# Patient Record
Sex: Female | Born: 1935 | Race: White | Hispanic: No | Smoking: Former smoker
Health system: Southern US, Community
[De-identification: ages and names within clinical notes are randomized; demographics above are authoritative.]

## PROBLEM LIST (undated history)

## (undated) DIAGNOSIS — Z681 Body mass index (BMI) 19 or less, adult: Secondary | ICD-10-CM

## (undated) DIAGNOSIS — D62 Acute posthemorrhagic anemia: Secondary | ICD-10-CM

## (undated) DIAGNOSIS — F419 Anxiety disorder, unspecified: Secondary | ICD-10-CM

## (undated) DIAGNOSIS — R011 Cardiac murmur, unspecified: Secondary | ICD-10-CM

## (undated) DIAGNOSIS — R636 Underweight: Secondary | ICD-10-CM

## (undated) DIAGNOSIS — I10 Essential (primary) hypertension: Secondary | ICD-10-CM

## (undated) DIAGNOSIS — W19XXXA Unspecified fall, initial encounter: Secondary | ICD-10-CM

## (undated) DIAGNOSIS — Z87310 Personal history of (healed) osteoporosis fracture: Secondary | ICD-10-CM

## (undated) DIAGNOSIS — N182 Chronic kidney disease, stage 2 (mild): Secondary | ICD-10-CM

## (undated) HISTORY — DX: Body mass index (BMI) 19.9 or less, adult: Z68.1

## (undated) HISTORY — DX: Acute posthemorrhagic anemia: D62

## (undated) HISTORY — DX: Unspecified fall, initial encounter: W19.XXXA

## (undated) HISTORY — DX: Personal history of (healed) osteoporosis fracture: Z87.310

## (undated) HISTORY — DX: Cardiac murmur, unspecified: R01.1

## (undated) HISTORY — DX: Chronic kidney disease, stage 2 (mild): N18.2

## (undated) HISTORY — PX: ABDOMINAL HYSTERECTOMY: SHX81

## (undated) HISTORY — DX: Underweight: R63.6

---

## 1998-03-14 ENCOUNTER — Ambulatory Visit (HOSPITAL_COMMUNITY): Admission: RE | Admit: 1998-03-14 | Discharge: 1998-03-14 | Payer: Self-pay

## 2005-07-24 ENCOUNTER — Other Ambulatory Visit: Admission: RE | Admit: 2005-07-24 | Discharge: 2005-07-24 | Payer: Self-pay | Admitting: Obstetrics & Gynecology

## 2018-05-26 ENCOUNTER — Emergency Department (HOSPITAL_COMMUNITY): Payer: Medicare Other

## 2018-05-26 ENCOUNTER — Other Ambulatory Visit: Payer: Self-pay

## 2018-05-26 ENCOUNTER — Inpatient Hospital Stay (HOSPITAL_COMMUNITY)
Admission: EM | Admit: 2018-05-26 | Discharge: 2018-05-31 | DRG: 470 | Disposition: A | Discharge status: Skilled Nursing Facility | Payer: Medicare Other | Attending: Nephrology | Admitting: Nephrology

## 2018-05-26 ENCOUNTER — Encounter (HOSPITAL_COMMUNITY): Payer: Self-pay | Admitting: Emergency Medicine

## 2018-05-26 DIAGNOSIS — S72011A Unspecified intracapsular fracture of right femur, initial encounter for closed fracture: Principal | ICD-10-CM | POA: Diagnosis present

## 2018-05-26 DIAGNOSIS — F419 Anxiety disorder, unspecified: Secondary | ICD-10-CM | POA: Diagnosis present

## 2018-05-26 DIAGNOSIS — Z87891 Personal history of nicotine dependence: Secondary | ICD-10-CM

## 2018-05-26 DIAGNOSIS — F0390 Unspecified dementia without behavioral disturbance: Secondary | ICD-10-CM | POA: Diagnosis present

## 2018-05-26 DIAGNOSIS — E871 Hypo-osmolality and hyponatremia: Secondary | ICD-10-CM | POA: Diagnosis present

## 2018-05-26 DIAGNOSIS — Z886 Allergy status to analgesic agent status: Secondary | ICD-10-CM

## 2018-05-26 DIAGNOSIS — S72001A Fracture of unspecified part of neck of right femur, initial encounter for closed fracture: Secondary | ICD-10-CM | POA: Diagnosis not present

## 2018-05-26 DIAGNOSIS — D649 Anemia, unspecified: Secondary | ICD-10-CM | POA: Diagnosis not present

## 2018-05-26 DIAGNOSIS — M25551 Pain in right hip: Secondary | ICD-10-CM | POA: Diagnosis present

## 2018-05-26 DIAGNOSIS — T148XXA Other injury of unspecified body region, initial encounter: Secondary | ICD-10-CM

## 2018-05-26 DIAGNOSIS — Z885 Allergy status to narcotic agent status: Secondary | ICD-10-CM

## 2018-05-26 DIAGNOSIS — K56609 Unspecified intestinal obstruction, unspecified as to partial versus complete obstruction: Secondary | ICD-10-CM | POA: Diagnosis present

## 2018-05-26 DIAGNOSIS — W19XXXA Unspecified fall, initial encounter: Secondary | ICD-10-CM

## 2018-05-26 DIAGNOSIS — Y92019 Unspecified place in single-family (private) house as the place of occurrence of the external cause: Secondary | ICD-10-CM

## 2018-05-26 DIAGNOSIS — W1830XA Fall on same level, unspecified, initial encounter: Secondary | ICD-10-CM | POA: Diagnosis present

## 2018-05-26 DIAGNOSIS — I10 Essential (primary) hypertension: Secondary | ICD-10-CM | POA: Diagnosis present

## 2018-05-26 DIAGNOSIS — R42 Dizziness and giddiness: Secondary | ICD-10-CM | POA: Diagnosis not present

## 2018-05-26 DIAGNOSIS — D62 Acute posthemorrhagic anemia: Secondary | ICD-10-CM

## 2018-05-26 HISTORY — DX: Essential (primary) hypertension: I10

## 2018-05-26 HISTORY — DX: Anemia, unspecified: D64.9

## 2018-05-26 HISTORY — DX: Anxiety disorder, unspecified: F41.9

## 2018-05-26 HISTORY — DX: Hypo-osmolality and hyponatremia: E87.1

## 2018-05-26 HISTORY — DX: Fracture of unspecified part of neck of right femur, initial encounter for closed fracture: S72.001A

## 2018-05-26 LAB — CBC WITH DIFFERENTIAL/PLATELET
Abs Immature Granulocytes: 0.04 10*3/uL (ref 0.00–0.07)
BASOS PCT: 0 %
Basophils Absolute: 0 10*3/uL (ref 0.0–0.1)
Eosinophils Absolute: 0 10*3/uL (ref 0.0–0.5)
Eosinophils Relative: 1 %
HCT: 31.4 % — ABNORMAL LOW (ref 36.0–46.0)
Hemoglobin: 9.7 g/dL — ABNORMAL LOW (ref 12.0–15.0)
Immature Granulocytes: 1 %
Lymphocytes Relative: 6 %
Lymphs Abs: 0.5 10*3/uL — ABNORMAL LOW (ref 0.7–4.0)
MCH: 29.8 pg (ref 26.0–34.0)
MCHC: 30.9 g/dL (ref 30.0–36.0)
MCV: 96.3 fL (ref 80.0–100.0)
MONO ABS: 0.5 10*3/uL (ref 0.1–1.0)
Monocytes Relative: 6 %
Neutro Abs: 7.3 10*3/uL (ref 1.7–7.7)
Neutrophils Relative %: 86 %
Platelets: 147 10*3/uL — ABNORMAL LOW (ref 150–400)
RBC: 3.26 MIL/uL — ABNORMAL LOW (ref 3.87–5.11)
RDW: 12.8 % (ref 11.5–15.5)
WBC: 8.4 10*3/uL (ref 4.0–10.5)
nRBC: 0 % (ref 0.0–0.2)

## 2018-05-26 LAB — PROTIME-INR
INR: 1 (ref 0.8–1.2)
Prothrombin Time: 13.4 s (ref 11.4–15.2)

## 2018-05-26 LAB — COMPREHENSIVE METABOLIC PANEL WITH GFR
ALT: 17 U/L (ref 0–44)
AST: 29 U/L (ref 15–41)
Albumin: 4.2 g/dL (ref 3.5–5.0)
Alkaline Phosphatase: 74 U/L (ref 38–126)
Anion gap: 6 (ref 5–15)
BUN: 18 mg/dL (ref 8–23)
CO2: 25 mmol/L (ref 22–32)
Calcium: 9.3 mg/dL (ref 8.9–10.3)
Chloride: 103 mmol/L (ref 98–111)
Creatinine, Ser: 1.07 mg/dL — ABNORMAL HIGH (ref 0.44–1.00)
GFR calc Af Amer: 56 mL/min — ABNORMAL LOW
GFR calc non Af Amer: 48 mL/min — ABNORMAL LOW
Glucose, Bld: 135 mg/dL — ABNORMAL HIGH (ref 70–99)
Potassium: 4.5 mmol/L (ref 3.5–5.1)
Sodium: 134 mmol/L — ABNORMAL LOW (ref 135–145)
Total Bilirubin: 0.4 mg/dL (ref 0.3–1.2)
Total Protein: 6.8 g/dL (ref 6.5–8.1)

## 2018-05-26 MED ORDER — HEPARIN SODIUM (PORCINE) 5000 UNIT/ML IJ SOLN
5000.0000 [IU] | Freq: Three times a day (TID) | INTRAMUSCULAR | Status: DC
  Administered 2018-05-26: 5000 [IU] via SUBCUTANEOUS

## 2018-05-26 MED ORDER — ONDANSETRON HCL 4 MG PO TABS: 4.0000 mg | ORAL_TABLET | Freq: Four times a day (QID) | ORAL | Status: DC | PRN

## 2018-05-26 MED ORDER — HEPARIN SODIUM (PORCINE) 5000 UNIT/ML IJ SOLN: 5000.0000 [IU] | Freq: Three times a day (TID) | INTRAMUSCULAR | Status: DC

## 2018-05-26 MED ORDER — ACETAMINOPHEN 650 MG RE SUPP: 650.0000 mg | Freq: Four times a day (QID) | RECTAL | Status: DC | PRN

## 2018-05-26 MED ORDER — SODIUM CHLORIDE 0.9 % IV SOLN
INTRAVENOUS | Status: DC
  Administered 2018-05-27 (×3): via INTRAVENOUS

## 2018-05-26 MED ORDER — FENTANYL CITRATE (PF) 100 MCG/2ML IJ SOLN
50.0000 ug | INTRAMUSCULAR | Status: DC | PRN
  Administered 2018-05-26 – 2018-05-27 (×2): 50 ug via INTRAVENOUS
  Filled 2018-05-26 (×2): qty 2

## 2018-05-26 MED ORDER — FENTANYL CITRATE (PF) 100 MCG/2ML IJ SOLN
50.0000 ug | Freq: Once | INTRAMUSCULAR | Status: AC
  Administered 2018-05-26: 50 ug via INTRAVENOUS
  Filled 2018-05-26: qty 2

## 2018-05-26 MED ORDER — ACETAMINOPHEN 325 MG PO TABS
650.0000 mg | ORAL_TABLET | Freq: Four times a day (QID) | ORAL | Status: DC | PRN
  Administered 2018-05-27: 650 mg via ORAL
  Filled 2018-05-26: qty 2

## 2018-05-26 MED ORDER — ONDANSETRON HCL 4 MG/2ML IJ SOLN
4.0000 mg | Freq: Four times a day (QID) | INTRAMUSCULAR | Status: DC | PRN
  Filled 2018-05-26: qty 2

## 2018-05-26 NOTE — ED Triage Notes (Signed)
Pt BIB [employer], mechanical fall at home. C/o right hip pain. EMS noted rotation and shortening of the right leg. EMS VSS. Denies dizziness, LOC or hitting her head at time of fall.

## 2018-05-26 NOTE — H&P (Signed)
History and Physical    Lydia Klein FMB:[id number] DOB: [birth year] DOA: 05/26/2018  PCP: Elisabeth Most, FNP  Patient coming from: Home.  I have personally briefly reviewed patient's old medical records in [facility]  Chief Complaint: Fall.  HPI: Lydia Klein is a 85 y.o. female with medical history significant of hypertension, anxiety who had an accidental fall at home landing on her right side, developing pain immediately and inability to get up.  She had her son called EMS and she was subsequently transported to the emergency department.  She denies fever, chills, sore throat, dyspnea, wheezing, hemoptysis, chest pain, palpitations, diaphoresis, dizziness, PND, orthopnea or pitting edema of the lower extremities.  No abdominal pain, nausea or emesis, constipation or diarrhea, hematochezia or melena.  No dysuria, frequency hematuria.  Denies polyuria, polydipsia, polyphagia or blurred vision.  ED Course: Initial vital signs temperature 98.7 F, pulse 97, respirations 16, 103/74 mmHg and O2 sat 98% on room air.  Patient received fentanyl 50 mcg IVP in the emergency department.  White count is 8.4, hemoglobin 9.7 g/dL and platelets 701.  PT was 13.4 seconds and INR 1.0.  CMP shows a sodium 134 mmol/L.  Glucose 135 and creatinine 1.07 mg/dL.  All other values are within normal limits.  Imaging: Pelvic and femur x-ray showed displaced fracture within the subcapital portion of the right femoral neck.  Chest radiograph no active disease.  CT head without contrast no acute intracranial pathology.  Review of Systems: As per HPI otherwise 10 point review of systems negative.   Past Medical History:  Diagnosis Date  . Hypertension     History reviewed. No pertinent surgical history.   reports that she has quit smoking. She has never used smokeless tobacco. She reports previous alcohol use. She reports that she does not use drugs.  Allergies  Allergen Reactions  . Codeine     . Ibuprofen Swelling   Family history Per patient, both parents died of old age. She does not know of any other medical history in her family  Prior to Admission medications   Not on File    Physical Exam: Vitals:   05/26/18 2115 05/26/18 2145 05/26/18 2200 05/26/18 2244  BP: (!) 131/56  (!) 136/55 (!) 129/58  Pulse: 96  94 94  Resp: 14 17  18   Temp:    99 F (37.2 C)  TempSrc:    Oral  SpO2: 99%  99% 98%    Constitutional: NAD, calm, comfortable Eyes: PERRL, lids and conjunctivae normal ENMT: Mucous membranes are moist. Posterior pharynx clear of any exudate or lesions. Neck: normal, supple, no masses, no thyromegaly Respiratory: clear to auscultation bilaterally, no wheezing, no crackles. Normal respiratory effort. No accessory muscle use.  Cardiovascular: Regular rate and rhythm, no murmurs / rubs / gallops. No extremity edema. 2+ pedal pulses. No carotid bruits.  Abdomen: Soft, no tenderness, no masses palpated. No hepatosplenomegaly. Bowel sounds positive.  Musculoskeletal: no clubbing / cyanosis.  RLE is shortened and externally rotated, severely decreased ROM of RLE, Positive right hip tenderness on palpation. no contractures. Normal muscle tone.  Skin: no rashes, lesions, ulcers. No induration Neurologic: CN 2-12 grossly intact. Sensation intact, DTR normal. Strength 5/5 in all 4.  Psychiatric: Normal judgment and insight. Alert and oriented x 3. Normal mood.   Labs on Admission: I have personally reviewed following labs and imaging studies  CBC: Recent Labs  Lab 05/26/18 1945  WBC 8.4  NEUTROABS 7.3  HGB 9.7*  HCT 31.4*  MCV 96.3  PLT 147*   Basic Metabolic Panel: Recent Labs  Lab 05/26/18 1908  NA 134*  K 4.5  CL 103  CO2 25  GLUCOSE 135*  BUN 18  CREATININE 1.07*  CALCIUM 9.3   GFR: CrCl cannot be calculated (Unknown ideal weight.). Liver Function Tests: Recent Labs  Lab 05/26/18 1908  AST 29  ALT 17  ALKPHOS 74  BILITOT 0.4  PROT 6.8   ALBUMIN 4.2   No results for input(s): LIPASE, AMYLASE in the last 168 hours. No results for input(s): AMMONIA in the last 168 hours. Coagulation Profile: Recent Labs  Lab 05/26/18 1908  INR 1.0   Cardiac Enzymes: No results for input(s): CKTOTAL, CKMB, CKMBINDEX, TROPONINI in the last 168 hours. BNP (last 3 results) No results for input(s): PROBNP in the last 8760 hours. HbA1C: No results for input(s): HGBA1C in the last 72 hours. CBG: No results for input(s): GLUCAP in the last 168 hours. Lipid Profile: No results for input(s): CHOL, HDL, LDLCALC, TRIG, CHOLHDL, LDLDIRECT in the last 72 hours. Thyroid Function Tests: No results for input(s): TSH, T4TOTAL, FREET4, T3FREE, THYROIDAB in the last 72 hours. Anemia Panel: No results for input(s): VITAMINB12, FOLATE, FERRITIN, TIBC, IRON, RETICCTPCT in the last 72 hours. Urine analysis: No results found for: COLORURINE, APPEARANCEUR, LABSPEC, PHURINE, GLUCOSEU, HGBUR, BILIRUBINUR, KETONESUR, PROTEINUR, UROBILINOGEN, NITRITE, LEUKOCYTESUR  Radiological Exams on Admission: Dg Chest 1 View  Result Date: 05/26/2018 CLINICAL DATA:  Fall, pain. EXAM: CHEST  1 VIEW COMPARISON:  None. FINDINGS: Heart size and mediastinal contours are within normal limits. Lungs are clear. No pleural effusion or pneumothorax seen. Osseous structures about the chest are unremarkable. IMPRESSION: No active disease. Electronically Signed   By: Bary Richard M.D.   On: 05/26/2018 20:35   Dg Pelvis 1-2 Views  Result Date: 05/26/2018 CLINICAL DATA:  Fall, pain. EXAM: PELVIS - 1-2 VIEW COMPARISON:  None. FINDINGS: Significantly displaced fracture within the subcapital region of the RIGHT femoral neck. No fractures seen within the adjacent osseous pelvis or about the LEFT hip. IMPRESSION: Significantly displaced fracture within the subcapital region of the RIGHT femoral neck. Electronically Signed   By: Bary Richard M.D.   On: 05/26/2018 20:34   Ct Head Wo  Contrast  Result Date: 05/26/2018 CLINICAL DATA:  85 y/o  F; mechanical fall to the right-sided body. EXAM: CT HEAD WITHOUT CONTRAST TECHNIQUE: Contiguous axial images were obtained from the base of the skull through the vertex without intravenous contrast. COMPARISON:  None. FINDINGS: Brain: No evidence of acute infarction, hemorrhage, hydrocephalus, extra-axial collection or mass lesion/mass effect. Nonspecific white matter hypodensities are compatible with mild chronic microvascular ischemic changes and there is mild volume loss of the brain for age. Vascular: Calcific atherosclerosis of the carotid siphons. No hyperdense vessel identified. Skull: Normal. Negative for fracture or focal lesion. Sinuses/Orbits: Small left maxillary and left posterior ethmoid air cell mucous retention cyst. Additional visible paranasal sinuses and the mastoid air cells are normally aerated. Other: None. IMPRESSION: 1. No acute intracranial abnormality identified. 2. Mild chronic microvascular ischemic changes and mild volume loss of the brain for age. Electronically Signed   By: Mitzi Hansen M.D.   On: 05/26/2018 21:41   Dg Femur, Min 2 Views Right  Result Date: 05/26/2018 CLINICAL DATA:  Fall, pain. EXAM: RIGHT FEMUR 2 VIEWS COMPARISON:  None. FINDINGS: Displaced fracture within the subcapital portion of the RIGHT femoral neck. Femoral head remains normally positioned relative to the acetabulum. Nearly  90 degree angulation deformity at the fracture site. Remainder of the RIGHT femur appears intact and normally aligned. IMPRESSION: Displaced fracture within the subcapital portion of the RIGHT femoral neck. Electronically Signed   By: Bary Richard M.D.   On: 05/26/2018 20:33    EKG: Independently reviewed. Ordered. Still pending.  Assessment/Plan Principal Problem:   Closed right hip fracture, initial encounter (HCC) Admit to telemetry/inpatient. Keep n.p.o. Continue IV fluids. Analgesics as  needed. Antiemetics as needed. Will need hip surgical repair or replacement. Orthopedic surgery will evaluate in a.m.  Active Problems:   Hypertension On unknown antihypertensive at home. Will use low-dose PRN IV metoprolol. Monitor blood pressure.    Hyponatremia Minimal. Continue normal saline infusion. Follow-up sodium level.    Normocytic anemia Check anemia panel. Follow-up H&H.   DVT prophylaxis:  Heparin SQ. Code Status: Full code. Family Communication: Disposition Plan: Admit for pain control, orthopedic surgery evaluation. Consults called: Orthopedic surgery will evaluate the patient in the morning. Admission status: Inpatient/telemetry.   Bobette Mo MD [facility]  05/26/2018, 11:00 PM   This document was prepared using Dragon voice recognition software and may contain some unintended transcription errors.

## 2018-05-26 NOTE — ED Provider Notes (Signed)
[facility] EMERGENCY DEPARTMENT Provider Note   CSN: [id number] Arrival date & time: 05/26/18  1856    History   Chief Complaint Chief Complaint  Patient presents with  . Fall  . Hip Pain    HPI Lydia Klein is a 85 y.o. female.      Fall  This is a new problem. The current episode started 1 to 2 hours ago. The problem occurs rarely. The problem has not changed since onset.Pertinent negatives include no chest pain, no abdominal pain, no headaches and no shortness of breath. Nothing aggravates the symptoms. Nothing relieves the symptoms. She has tried nothing for the symptoms. The treatment provided no relief.  Hip Pain  This is a new problem. The current episode started 1 to 2 hours ago. The problem occurs constantly. The problem has not changed since onset.Pertinent negatives include no chest pain, no abdominal pain, no headaches and no shortness of breath. The symptoms are aggravated by walking. The symptoms are relieved by lying down, rest, position and relaxation. She has tried nothing for the symptoms. The treatment provided no relief.    Past Medical History:  Diagnosis Date  . Hypertension     Patient Active Problem List   Diagnosis Date Noted  . Closed right hip fracture, initial encounter (HCC) 05/26/2018  . Hypertension 05/26/2018  . Hyponatremia 05/26/2018  . Normocytic anemia 05/26/2018    History reviewed. No pertinent surgical history.   OB History   No obstetric history on file.      Home Medications    Prior to Admission medications   Not on File    Family History History reviewed. No pertinent family history.  Social History Social History   Tobacco Use  . Smoking status: Former Games developer  . Smokeless tobacco: Never Used  Substance Use Topics  . Alcohol use: Not Currently  . Drug use: Never     Allergies   Codeine and Ibuprofen   Review of Systems Review of Systems  Constitutional: Negative for chills and  fever.  HENT: Negative for ear pain and sore throat.   Eyes: Negative for pain and visual disturbance.  Respiratory: Negative for cough and shortness of breath.   Cardiovascular: Negative for chest pain and palpitations.  Gastrointestinal: Negative for abdominal pain and vomiting.  Genitourinary: Negative for dysuria and hematuria.  Musculoskeletal: Negative for arthralgias and back pain.  Skin: Negative for color change and rash.  Neurological: Negative for seizures, syncope and headaches.  All other systems reviewed and are negative.    Physical Exam Updated Vital Signs BP (!) 129/58 (BP Location: Left Arm)   Pulse 94   Temp 99 F (37.2 C) (Oral)   Resp 18   SpO2 98%   Physical Exam Vitals signs and nursing note reviewed.  Constitutional:      General: She is not in acute distress.    Appearance: She is well-developed.     Comments: Patient resting comfortably, no acute distress.  HENT:     Head: Normocephalic and atraumatic.  Eyes:     Conjunctiva/sclera: Conjunctivae normal.  Neck:     Musculoskeletal: Neck supple.     Comments: No C-spine tenderness Cardiovascular:     Rate and Rhythm: Normal rate and regular rhythm.     Heart sounds: No murmur.  Pulmonary:     Effort: Pulmonary effort is normal. No respiratory distress.     Breath sounds: Normal breath sounds.  Abdominal:     Palpations: Abdomen  is soft.     Tenderness: There is no abdominal tenderness.  Musculoskeletal:        General: Tenderness and signs of injury present.     Comments: Decreased range of motion of the right hip due to pain, right extremity is externally rotated, minimally shortened.  Intact pulse, motor, sensory distal to the injury.  No overlying skin changes, abrasions.  No tenderness to the T, L-spine.  Skin:    General: Skin is warm and dry.     Capillary Refill: Capillary refill takes less than 2 seconds.  Neurological:     General: No focal deficit present.     Mental Status: She  is alert.  Psychiatric:        Mood and Affect: Mood normal.      ED Treatments / Results  Labs (all labs ordered are listed, but only abnormal results are displayed) Labs Reviewed  COMPREHENSIVE METABOLIC PANEL - Abnormal; Notable for the following components:      Result Value   Sodium 134 (*)    Glucose, Bld 135 (*)    Creatinine, Ser 1.07 (*)    GFR calc non Af Amer 48 (*)    GFR calc Af Amer 56 (*)    All other components within normal limits  CBC WITH DIFFERENTIAL/PLATELET - Abnormal; Notable for the following components:   RBC 3.26 (*)    Hemoglobin 9.7 (*)    HCT 31.4 (*)    Platelets 147 (*)    Lymphs Abs 0.5 (*)    All other components within normal limits  PROTIME-INR  CBC WITH DIFFERENTIAL/PLATELET  CBC WITH DIFFERENTIAL/PLATELET  COMPREHENSIVE METABOLIC PANEL    EKG None  Radiology Dg Chest 1 View  Result Date: 05/26/2018 CLINICAL DATA:  Fall, pain. EXAM: CHEST  1 VIEW COMPARISON:  None. FINDINGS: Heart size and mediastinal contours are within normal limits. Lungs are clear. No pleural effusion or pneumothorax seen. Osseous structures about the chest are unremarkable. IMPRESSION: No active disease. Electronically Signed   By: Bary Richard M.D.   On: 05/26/2018 20:35   Dg Pelvis 1-2 Views  Result Date: 05/26/2018 CLINICAL DATA:  Fall, pain. EXAM: PELVIS - 1-2 VIEW COMPARISON:  None. FINDINGS: Significantly displaced fracture within the subcapital region of the RIGHT femoral neck. No fractures seen within the adjacent osseous pelvis or about the LEFT hip. IMPRESSION: Significantly displaced fracture within the subcapital region of the RIGHT femoral neck. Electronically Signed   By: Bary Richard M.D.   On: 05/26/2018 20:34   Ct Head Wo Contrast  Result Date: 05/26/2018 CLINICAL DATA:  85 y/o  F; mechanical fall to the right-sided body. EXAM: CT HEAD WITHOUT CONTRAST TECHNIQUE: Contiguous axial images were obtained from the base of the skull through the vertex  without intravenous contrast. COMPARISON:  None. FINDINGS: Brain: No evidence of acute infarction, hemorrhage, hydrocephalus, extra-axial collection or mass lesion/mass effect. Nonspecific white matter hypodensities are compatible with mild chronic microvascular ischemic changes and there is mild volume loss of the brain for age. Vascular: Calcific atherosclerosis of the carotid siphons. No hyperdense vessel identified. Skull: Normal. Negative for fracture or focal lesion. Sinuses/Orbits: Small left maxillary and left posterior ethmoid air cell mucous retention cyst. Additional visible paranasal sinuses and the mastoid air cells are normally aerated. Other: None. IMPRESSION: 1. No acute intracranial abnormality identified. 2. Mild chronic microvascular ischemic changes and mild volume loss of the brain for age. Electronically Signed   By: Buzzy Han.D.  On: 05/26/2018 21:41   Dg Femur, Min 2 Views Right  Result Date: 05/26/2018 CLINICAL DATA:  Fall, pain. EXAM: RIGHT FEMUR 2 VIEWS COMPARISON:  None. FINDINGS: Displaced fracture within the subcapital portion of the RIGHT femoral neck. Femoral head remains normally positioned relative to the acetabulum. Nearly 90 degree angulation deformity at the fracture site. Remainder of the RIGHT femur appears intact and normally aligned. IMPRESSION: Displaced fracture within the subcapital portion of the RIGHT femoral neck. Electronically Signed   By: Bary Richard M.D.   On: 05/26/2018 20:33    Procedures Procedures (including critical care time)  Medications Ordered in ED Medications  fentaNYL (SUBLIMAZE) injection 50 mcg (50 mcg Intravenous Given 05/26/18 2302)  heparin injection 5,000 Units (has no administration in time range)  0.9 %  sodium chloride infusion (has no administration in time range)  acetaminophen (TYLENOL) tablet 650 mg (has no administration in time range)    Or  acetaminophen (TYLENOL) suppository 650 mg (has no administration  in time range)  ondansetron (ZOFRAN) tablet 4 mg (has no administration in time range)    Or  ondansetron (ZOFRAN) injection 4 mg (has no administration in time range)  fentaNYL (SUBLIMAZE) injection 50 mcg (50 mcg Intravenous Given 05/26/18 1928)     Initial Impression / Assessment and Plan / ED Course  I have reviewed the triage vital signs and the nursing notes.  Pertinent labs & imaging results that were available during my care of the patient were reviewed by me and considered in my medical decision making (see chart for details).        85 year old female who presents from home after ground-level fall while she was carrying a ladder.  Patient indicates that she was turning at which time with a ladder she fell onto her right hip experiencing pain at that time.  Patient denied hitting her head, loss of consciousness or any other injury.  Patient was on the ground approximately 30 minutes and crawled to the phone by time family and EMS arrived.  Physical exam consistent with possible right hip fracture.  Neurovascularly intact.  X-ray imaging indicate right hip fracture.  Orthopedics consulted who indicated to admit the patient, n.p.o. midnight.  Inpatient team consulted for admission.  Laboratory study and imaging reviewed by myself and using decision making regarding management.  Patient admitted in stable condition with stable vital signs.  The above care was discussed and agreed upon by my attending physician.  Final Clinical Impressions(s) / ED Diagnoses   Final diagnoses:  Closed fracture of right hip, initial encounter [facility])    ED Discharge Orders    None       Dahlia Client, MD 05/26/18 2320    Derwood Kaplan, MD 05/27/18 0040

## 2018-05-26 NOTE — ED Notes (Signed)
Attempted report x1. 

## 2018-05-26 NOTE — Progress Notes (Signed)
Reviewed imaging. 85 year old female with right displaced femoral neck fracture. Will plan for hemiarthroplasty tomorrow. Please make NPO after midnight. Formal consult to follow in AM.  Roby Lofts, MD [facility] [phone] (phone)

## 2018-05-27 ENCOUNTER — Inpatient Hospital Stay (HOSPITAL_COMMUNITY): Payer: Medicare Other

## 2018-05-27 ENCOUNTER — Inpatient Hospital Stay (HOSPITAL_COMMUNITY): Payer: Medicare Other | Admitting: Certified Registered Nurse Anesthetist

## 2018-05-27 ENCOUNTER — Encounter (HOSPITAL_COMMUNITY)
Admission: EM | Disposition: A | Discharge status: Skilled Nursing Facility | Payer: Self-pay | Source: Home / Self Care | Attending: Family Medicine

## 2018-05-27 ENCOUNTER — Encounter (HOSPITAL_COMMUNITY): Payer: Self-pay | Admitting: Anesthesiology

## 2018-05-27 DIAGNOSIS — F419 Anxiety disorder, unspecified: Secondary | ICD-10-CM | POA: Diagnosis present

## 2018-05-27 HISTORY — DX: Anxiety disorder, unspecified: F41.9

## 2018-05-27 HISTORY — PX: HIP ARTHROPLASTY: SHX981

## 2018-05-27 LAB — COMPREHENSIVE METABOLIC PANEL
ALBUMIN: 3.2 g/dL — AB (ref 3.5–5.0)
ALT: 12 U/L (ref 0–44)
AST: 18 U/L (ref 15–41)
Alkaline Phosphatase: 58 U/L (ref 38–126)
Anion gap: 6 (ref 5–15)
BUN: 17 mg/dL (ref 8–23)
CO2: 20 mmol/L — AB (ref 22–32)
Calcium: 8.1 mg/dL — ABNORMAL LOW (ref 8.9–10.3)
Chloride: 109 mmol/L (ref 98–111)
Creatinine, Ser: 0.84 mg/dL (ref 0.44–1.00)
GFR calc Af Amer: >60 mL/min (ref >60)
GFR calc non Af Amer: >60 mL/min (ref >60)
Glucose, Bld: 134 mg/dL — ABNORMAL HIGH (ref 70–99)
Potassium: 3.7 mmol/L (ref 3.5–5.1)
SODIUM: 135 mmol/L (ref 135–145)
Total Bilirubin: 0.9 mg/dL (ref 0.3–1.2)
Total Protein: 5.4 g/dL — ABNORMAL LOW (ref 6.5–8.1)

## 2018-05-27 LAB — CBC WITH DIFFERENTIAL/PLATELET
Abs Immature Granulocytes: 0 10*3/uL (ref 0.00–0.07)
Basophils Absolute: 0 10*3/uL (ref 0.0–0.1)
Basophils Relative: 0 %
Eosinophils Absolute: 0 10*3/uL (ref 0.0–0.5)
Eosinophils Relative: 0 %
HCT: 32.9 % — ABNORMAL LOW (ref 36.0–46.0)
Hemoglobin: 10.8 g/dL — ABNORMAL LOW (ref 12.0–15.0)
Lymphocytes Relative: 3 %
Lymphs Abs: 0.2 10*3/uL — ABNORMAL LOW (ref 0.7–4.0)
MCH: 30.6 pg (ref 26.0–34.0)
MCHC: 32.8 g/dL (ref 30.0–36.0)
MCV: 93.2 fL (ref 80.0–100.0)
Monocytes Absolute: 0.3 10*3/uL (ref 0.1–1.0)
Monocytes Relative: 4 %
Neutro Abs: 7.5 10*3/uL (ref 1.7–7.7)
Neutrophils Relative %: 93 %
Platelets: 169 10*3/uL (ref 150–400)
RBC: 3.53 MIL/uL — AB (ref 3.87–5.11)
RDW: 12.8 % (ref 11.5–15.5)
WBC: 8.1 10*3/uL (ref 4.0–10.5)
nRBC: 0 % (ref 0.0–0.2)
nRBC: 0 /100 WBC

## 2018-05-27 LAB — CBC
HCT: 37.7 % (ref 36.0–46.0)
HEMOGLOBIN: 12 g/dL (ref 12.0–15.0)
MCH: 30 pg (ref 26.0–34.0)
MCHC: 31.8 g/dL (ref 30.0–36.0)
MCV: 94.3 fL (ref 80.0–100.0)
Platelets: 174 10*3/uL (ref 150–400)
RBC: 4 MIL/uL (ref 3.87–5.11)
RDW: 12.8 % (ref 11.5–15.5)
WBC: 10.6 10*3/uL — AB (ref 4.0–10.5)
nRBC: 0 % (ref 0.0–0.2)

## 2018-05-27 LAB — IRON AND TIBC
Iron: 33 ug/dL (ref 28–170)
Saturation Ratios: 12 % (ref 10.4–31.8)
TIBC: 277 ug/dL (ref 250–450)
UIBC: 244 ug/dL

## 2018-05-27 LAB — VITAMIN B12: VITAMIN B 12: 787 pg/mL (ref 180–914)

## 2018-05-27 LAB — SURGICAL PCR SCREEN
MRSA, PCR: NEGATIVE
Staphylococcus aureus: NEGATIVE

## 2018-05-27 LAB — RETICULOCYTES
Immature Retic Fract: 5.3 % (ref 2.3–15.9)
RBC.: 3.77 MIL/uL — ABNORMAL LOW (ref 3.87–5.11)
Retic Count, Absolute: 43.4 10*3/uL (ref 19.0–186.0)
Retic Ct Pct: 1.2 % (ref 0.4–3.1)

## 2018-05-27 LAB — CREATININE, SERUM
Creatinine, Ser: 0.95 mg/dL (ref 0.44–1.00)
GFR calc Af Amer: >60 mL/min (ref >60)
GFR calc non Af Amer: 56 mL/min — ABNORMAL LOW (ref >60)

## 2018-05-27 LAB — FERRITIN: Ferritin: 61 ng/mL (ref 11–307)

## 2018-05-27 LAB — FOLATE: Folate: 42.8 ng/mL (ref >5.9)

## 2018-05-27 SURGERY — HEMIARTHROPLASTY, HIP, DIRECT ANTERIOR APPROACH, FOR FRACTURE
Anesthesia: General | Site: Hip | Laterality: Right

## 2018-05-27 MED ORDER — ENOXAPARIN SODIUM 40 MG/0.4ML ~~LOC~~ SOLN
40.0000 mg | SUBCUTANEOUS | Status: DC
  Administered 2018-05-28 – 2018-05-31 (×4): 40 mg via SUBCUTANEOUS
  Filled 2018-05-27 (×4): qty 0.4

## 2018-05-27 MED ORDER — FENTANYL CITRATE (PF) 250 MCG/5ML IJ SOLN
INTRAMUSCULAR | Status: AC
  Filled 2018-05-27: qty 5

## 2018-05-27 MED ORDER — DEXAMETHASONE SODIUM PHOSPHATE 10 MG/ML IJ SOLN
INTRAMUSCULAR | Status: AC
  Filled 2018-05-27: qty 1

## 2018-05-27 MED ORDER — ACETAMINOPHEN 325 MG PO TABS: 325.0000 mg | ORAL_TABLET | Freq: Once | ORAL | Status: DC

## 2018-05-27 MED ORDER — FENTANYL CITRATE (PF) 250 MCG/5ML IJ SOLN
INTRAMUSCULAR | Status: DC | PRN
  Administered 2018-05-27 (×2): 25 ug via INTRAVENOUS
  Administered 2018-05-27: 50 ug via INTRAVENOUS
  Administered 2018-05-27 (×3): 25 ug via INTRAVENOUS

## 2018-05-27 MED ORDER — LACTATED RINGERS IV SOLN: INTRAVENOUS | Status: DC

## 2018-05-27 MED ORDER — ONDANSETRON HCL 4 MG/2ML IJ SOLN
4.0000 mg | Freq: Four times a day (QID) | INTRAMUSCULAR | Status: DC | PRN
  Filled 2018-05-27 (×2): qty 2

## 2018-05-27 MED ORDER — ONDANSETRON HCL 4 MG PO TABS: 4.0000 mg | ORAL_TABLET | Freq: Four times a day (QID) | ORAL | Status: DC | PRN

## 2018-05-27 MED ORDER — DEXAMETHASONE SODIUM PHOSPHATE 10 MG/ML IJ SOLN
INTRAMUSCULAR | Status: DC | PRN
  Administered 2018-05-27: 5 mg via INTRAVENOUS

## 2018-05-27 MED ORDER — METOPROLOL TARTRATE 5 MG/5ML IV SOLN: 2.5000 mg | INTRAVENOUS | Status: DC | PRN

## 2018-05-27 MED ORDER — DOCUSATE SODIUM 100 MG PO CAPS
100.0000 mg | ORAL_CAPSULE | Freq: Two times a day (BID) | ORAL | Status: DC
  Administered 2018-05-27 – 2018-05-31 (×8): 100 mg via ORAL
  Filled 2018-05-27 (×8): qty 1

## 2018-05-27 MED ORDER — LACTATED RINGERS IV SOLN
INTRAVENOUS | Status: DC
  Administered 2018-05-27 (×2): via INTRAVENOUS

## 2018-05-27 MED ORDER — ONDANSETRON HCL 4 MG/2ML IJ SOLN
INTRAMUSCULAR | Status: DC | PRN
  Administered 2018-05-27: 4 mg via INTRAVENOUS

## 2018-05-27 MED ORDER — FENTANYL CITRATE (PF) 100 MCG/2ML IJ SOLN
25.0000 ug | INTRAMUSCULAR | Status: DC | PRN
  Administered 2018-05-27: 25 ug via INTRAVENOUS

## 2018-05-27 MED ORDER — ROCURONIUM BROMIDE 50 MG/5ML IV SOSY
PREFILLED_SYRINGE | INTRAVENOUS | Status: DC | PRN
  Administered 2018-05-27: 50 mg via INTRAVENOUS

## 2018-05-27 MED ORDER — ACETAMINOPHEN 500 MG PO TABS
500.0000 mg | ORAL_TABLET | Freq: Three times a day (TID) | ORAL | Status: DC
  Administered 2018-05-27 – 2018-05-31 (×10): 500 mg via ORAL
  Filled 2018-05-27 (×10): qty 1

## 2018-05-27 MED ORDER — ENSURE ENLIVE PO LIQD
237.0000 mL | Freq: Two times a day (BID) | ORAL | Status: DC
  Administered 2018-05-27 – 2018-05-31 (×7): 237 mL via ORAL

## 2018-05-27 MED ORDER — LIDOCAINE 2% (20 MG/ML) 5 ML SYRINGE
INTRAMUSCULAR | Status: AC
  Filled 2018-05-27: qty 5

## 2018-05-27 MED ORDER — FENTANYL CITRATE (PF) 100 MCG/2ML IJ SOLN
INTRAMUSCULAR | Status: AC
  Filled 2018-05-27: qty 2

## 2018-05-27 MED ORDER — ROCURONIUM BROMIDE 50 MG/5ML IV SOSY
PREFILLED_SYRINGE | INTRAVENOUS | Status: AC
  Filled 2018-05-27: qty 5

## 2018-05-27 MED ORDER — MEPERIDINE HCL 50 MG/ML IJ SOLN: 6.2500 mg | INTRAMUSCULAR | Status: DC | PRN

## 2018-05-27 MED ORDER — LIDOCAINE 2% (20 MG/ML) 5 ML SYRINGE
INTRAMUSCULAR | Status: DC | PRN
  Administered 2018-05-27: 60 mg via INTRAVENOUS

## 2018-05-27 MED ORDER — METOCLOPRAMIDE HCL 5 MG/ML IJ SOLN: 5.0000 mg | Freq: Three times a day (TID) | INTRAMUSCULAR | Status: DC | PRN

## 2018-05-27 MED ORDER — PHENOL 1.4 % MT LIQD: 1.0000 | OROMUCOSAL | Status: DC | PRN

## 2018-05-27 MED ORDER — HYDROCODONE-ACETAMINOPHEN 5-325 MG PO TABS
1.0000 | ORAL_TABLET | ORAL | Status: DC | PRN
  Administered 2018-05-28: 2 via ORAL
  Administered 2018-05-29 – 2018-05-31 (×4): 1 via ORAL
  Filled 2018-05-27: qty 2
  Filled 2018-05-27 (×5): qty 1
  Filled 2018-05-27: qty 2

## 2018-05-27 MED ORDER — SODIUM CHLORIDE 0.9 % IV SOLN
INTRAVENOUS | Status: DC | PRN
  Administered 2018-05-27: 35 ug/min via INTRAVENOUS

## 2018-05-27 MED ORDER — MENTHOL 3 MG MT LOZG: 1.0000 | LOZENGE | OROMUCOSAL | Status: DC | PRN

## 2018-05-27 MED ORDER — ACETAMINOPHEN 160 MG/5ML PO SOLN: 325.0000 mg | Freq: Once | ORAL | Status: DC

## 2018-05-27 MED ORDER — CEFAZOLIN SODIUM-DEXTROSE 2-4 GM/100ML-% IV SOLN
2.0000 g | Freq: Four times a day (QID) | INTRAVENOUS | Status: AC
  Administered 2018-05-27 (×2): 2 g via INTRAVENOUS
  Filled 2018-05-27 (×2): qty 100

## 2018-05-27 MED ORDER — CEFAZOLIN SODIUM-DEXTROSE 2-3 GM-%(50ML) IV SOLR
INTRAVENOUS | Status: DC | PRN
  Administered 2018-05-27: 2 g via INTRAVENOUS

## 2018-05-27 MED ORDER — ACETAMINOPHEN 500 MG PO TABS: 500.0000 mg | ORAL_TABLET | Freq: Three times a day (TID) | ORAL | Status: DC

## 2018-05-27 MED ORDER — METOCLOPRAMIDE HCL 5 MG PO TABS: 5.0000 mg | ORAL_TABLET | Freq: Three times a day (TID) | ORAL | Status: DC | PRN

## 2018-05-27 MED ORDER — SUGAMMADEX SODIUM 200 MG/2ML IV SOLN
INTRAVENOUS | Status: DC | PRN
  Administered 2018-05-27: 108.8 mg via INTRAVENOUS

## 2018-05-27 MED ORDER — PROPOFOL 10 MG/ML IV BOLUS
INTRAVENOUS | Status: DC | PRN
  Administered 2018-05-27: 80 mg via INTRAVENOUS

## 2018-05-27 MED ORDER — MORPHINE SULFATE (PF) 2 MG/ML IV SOLN: 0.5000 mg | INTRAVENOUS | Status: DC | PRN

## 2018-05-27 MED ORDER — ACETAMINOPHEN 10 MG/ML IV SOLN: 1000.0000 mg | Freq: Once | INTRAVENOUS | Status: DC | PRN

## 2018-05-27 MED ORDER — ONDANSETRON HCL 4 MG/2ML IJ SOLN
INTRAMUSCULAR | Status: AC
  Filled 2018-05-27: qty 2

## 2018-05-27 MED ORDER — 0.9 % SODIUM CHLORIDE (POUR BTL) OPTIME
TOPICAL | Status: DC | PRN
  Administered 2018-05-27: 600 mL

## 2018-05-27 MED ORDER — VANCOMYCIN HCL IN DEXTROSE 1-5 GM/200ML-% IV SOLN
INTRAVENOUS | Status: AC
  Filled 2018-05-27: qty 200

## 2018-05-27 MED ORDER — PROMETHAZINE HCL 25 MG/ML IJ SOLN: 6.2500 mg | INTRAMUSCULAR | Status: DC | PRN

## 2018-05-27 SURGICAL SUPPLY — 58 items
BLADE SAW SAG 73X25 THK (BLADE) ×1
BLADE SAW SGTL 73X25 THK (BLADE) ×2 IMPLANT
BRUSH FEMORAL CANAL (MISCELLANEOUS) IMPLANT
BRUSH SCRUB SURG 4.25 DISP (MISCELLANEOUS) ×4 IMPLANT
COVER SURGICAL LIGHT HANDLE (MISCELLANEOUS) ×3 IMPLANT
COVER WAND RF STERILE (DRAPES) ×1 IMPLANT
DRAPE INCISE IOBAN 85X60 (DRAPES) ×3 IMPLANT
DRAPE ORTHO SPLIT 77X108 STRL (DRAPES) ×6
DRAPE SURG ORHT 6 SPLT 77X108 (DRAPES) ×2 IMPLANT
DRAPE U-SHAPE 47X51 STRL (DRAPES) ×1 IMPLANT
DRILL BIT 7/64X5 (BIT) ×3 IMPLANT
DRSG MEPILEX BORDER 4X8 (GAUZE/BANDAGES/DRESSINGS) ×3 IMPLANT
ELECT BLADE 6.5 EXT (BLADE) ×2 IMPLANT
ELECT CAUTERY BLADE 6.4 (BLADE) ×2 IMPLANT
ELECT REM PT RETURN 9FT ADLT (ELECTROSURGICAL) ×3
ELECTRODE REM PT RTRN 9FT ADLT (ELECTROSURGICAL) ×1 IMPLANT
GLOVE BIO SURGEON STRL SZ7.5 (GLOVE) ×3 IMPLANT
GLOVE BIO SURGEON STRL SZ8 (GLOVE) ×3 IMPLANT
GLOVE BIOGEL PI IND STRL 8 (GLOVE) ×2 IMPLANT
GLOVE BIOGEL PI INDICATOR 8 (GLOVE) ×4
GOWN STRL REUS W/ TWL LRG LVL3 (GOWN DISPOSABLE) ×2 IMPLANT
GOWN STRL REUS W/ TWL XL LVL3 (GOWN DISPOSABLE) ×1 IMPLANT
GOWN STRL REUS W/TWL 2XL LVL3 (GOWN DISPOSABLE) IMPLANT
GOWN STRL REUS W/TWL LRG LVL3 (GOWN DISPOSABLE) ×12
GOWN STRL REUS W/TWL XL LVL3 (GOWN DISPOSABLE)
HANDPIECE INTERPULSE COAX TIP (DISPOSABLE)
HEAD FEM UNIPOLAR 43 OD STRL (Hips) ×2 IMPLANT
IMMOBILIZER KNEE 20 (SOFTGOODS) ×3
IMMOBILIZER KNEE 20 THIGH 36 (SOFTGOODS) IMPLANT
KIT BASIN OR (CUSTOM PROCEDURE TRAY) ×3 IMPLANT
KIT TURNOVER KIT B (KITS) ×3 IMPLANT
MANIFOLD NEPTUNE II (INSTRUMENTS) ×3 IMPLANT
NDL 1/2 CIR MAYO (NEEDLE) IMPLANT
NEEDLE 1/2 CIR MAYO (NEEDLE) ×3 IMPLANT
NS IRRIG 1000ML POUR BTL (IV SOLUTION) ×3 IMPLANT
PACK TOTAL JOINT (CUSTOM PROCEDURE TRAY) ×3 IMPLANT
PAD ARMBOARD 7.5X6 YLW CONV (MISCELLANEOUS) ×6 IMPLANT
PILLOW ABDUCTION HIP (SOFTGOODS) IMPLANT
PRESSURIZER FEMORAL UNIV (MISCELLANEOUS) IMPLANT
RETRIEVER SUT HEWSON (MISCELLANEOUS) ×3 IMPLANT
SET HNDPC FAN SPRY TIP SCT (DISPOSABLE) IMPLANT
SPACER FEM TAPERED +0 12/14 (Hips) ×2 IMPLANT
STAPLER VISISTAT 35W (STAPLE) ×3 IMPLANT
STEM FEM CMT STD SZ3 38 (Stem) ×2 IMPLANT
SUT ETHILON 2 0 FS 18 (SUTURE) ×4 IMPLANT
SUT ETHILON 2 0 PSLX (SUTURE) ×2 IMPLANT
SUT FIBERWIRE #2 38 T-5 BLUE (SUTURE) ×12
SUT VIC AB 1 CT1 18XCR BRD 8 (SUTURE) ×1 IMPLANT
SUT VIC AB 1 CT1 27 (SUTURE) ×18
SUT VIC AB 1 CT1 27XBRD ANBCTR (SUTURE) ×1 IMPLANT
SUT VIC AB 1 CT1 8-18 (SUTURE) ×3
SUT VIC AB 2-0 CT1 27 (SUTURE) ×6
SUT VIC AB 2-0 CT1 TAPERPNT 27 (SUTURE) ×2 IMPLANT
SUTURE FIBERWR #2 38 T-5 BLUE (SUTURE) ×2 IMPLANT
TOWEL OR 17X24 6PK STRL BLUE (TOWEL DISPOSABLE) ×5 IMPLANT
TOWEL OR 17X26 10 PK STRL BLUE (TOWEL DISPOSABLE) ×3 IMPLANT
TOWER CARTRIDGE SMART MIX (DISPOSABLE) IMPLANT
WATER STERILE IRR 1000ML POUR (IV SOLUTION) ×5 IMPLANT

## 2018-05-27 NOTE — Progress Notes (Addendum)
PROGRESS NOTE  RHIONNA GIFFIN CBJ:[id number] DOB: 1934-01-15 DOA: 05/26/2018 PCP: Elisabeth Most, FNP  HPI/Recap of past 24 hours: This is an 85 year old female with past medical history of hypertension anxiety who had an accidental fall at home landing on her left right side she has an underlying dementia.  She sustained a right displaced femoral neck fracture.  Subjective: Patient seen immediate postop at her room.  Complaint she feels tired    Assessment/Plan: Principal Problem:   Closed right hip fracture, initial encounter (HCC) Active Problems:   Hypertension   Hyponatremia   Normocytic anemia   Anxiety   1.  Fall at home  2.  Closed right displaced femoral neck fracture status post  ARTHROPLASTY UNIPOLAR HIP (HEMIARTHROPLASTY) (Right) with [place] [place] high demand #2 stem, standard neck, and 36mm head  3.Hypertension controlled continue metoprolol  4.  Mild hyponatremia will replace with normal saline infusion  5.  Normocytic anemia we will continue to monitor H&H  Code Status: Full  Severity of Illness: The appropriate patient status for this patient is INPATIENT. Inpatient status is judged to be reasonable and necessary in order to provide the required intensity of service to ensure the patient's safety. The patient's presenting symptoms, physical exam findings, and initial radiographic and laboratory data in the context of their chronic comorbidities is felt to place them at high risk for further clinical deterioration. Furthermore, it is not anticipated that the patient will be medically stable for discharge from the hospital within 2 midnights of admission. The following factors support the patient status of inpatient.   " The patient's presenting symptoms include fall with right femoral neck fracture status post right hemiarthroplasty " The worrisome physical exam findings include . " The initial radiographic and laboratory data are worrisome because of  displaced fracture of the femoral neck.    * I certify that at the point of admission it is my clinical judgment that the patient will require inpatient hospital care spanning beyond 2 midnights from the point of admission due to high intensity of service, high risk for further deterioration and high frequency of surveillance required.*    Family Communication: None at bedside  Disposition Plan: Home when stable   Consultants:  Orthopedic surgery  Procedures:   right hemiarthroplasty  Antimicrobials:  None  DVT prophylaxis: Subacute heparin   Objective: Vitals:   05/27/18 0420 05/27/18 0741 05/27/18 1145 05/27/18 1200  BP: 131/60  126/62 (!) 118/59  Pulse: 97  88 75  Resp: 18  14 12   Temp: 99 F (37.2 C)  97.7 F (36.5 C)   TempSrc: Oral     SpO2: 99%  98% 94%  Weight:  54.4 kg    Height:  5\' 3"  (1.6 m)      Intake/Output Summary (Last 24 hours) at 05/27/2018 1211 Last data filed at 05/27/2018 1145 Gross per 24 hour  Intake 1491.94 ml  Output 50 ml  Net 1441.94 ml   Filed Weights   05/27/18 0741  Weight: 54.4 kg   Body mass index is 21.26 kg/m.  Exam:  . General: 85 y.o. year-old female well developed well nourished in no acute distress.  Alert and oriented x3. . Cardiovascular: Regular rate and rhythm with no rubs or gallops.  No thyromegaly or JVD noted.   Marland Kitchen Respiratory: Clear to auscultation with no wheezes or rales. Good inspiratory effort. . Abdomen: Soft nontender nondistended with normal bowel sounds x4 quadrants. . Musculoskeletal: No lower extremity edema. 2/4  pulses in all 4 extremities. . Skin: No ulcerative lesions noted or rashes, Psychiatry: Mood and affect is appropriate   Data Reviewed: CBC: Recent Labs  Lab 05/26/18 1945 05/27/18 0213  WBC 8.4 8.1  NEUTROABS 7.3 7.5  HGB 9.7* 10.8*  HCT 31.4* 32.9*  MCV 96.3 93.2  PLT 147* 169   Basic Metabolic Panel: Recent Labs  Lab 05/26/18 1908 05/27/18 0213  NA 134* 135  K 4.5  3.7  CL 103 109  CO2 25 20*  GLUCOSE 135* 134*  BUN 18 17  CREATININE 1.07* 0.84  CALCIUM 9.3 8.1*   GFR: Estimated Creatinine Clearance: 42.7 mL/min (by C-G formula based on SCr of 0.84 mg/dL). Liver Function Tests: Recent Labs  Lab 05/26/18 1908 05/27/18 0213  AST 29 18  ALT 17 12  ALKPHOS 74 58  BILITOT 0.4 0.9  PROT 6.8 5.4*  ALBUMIN 4.2 3.2*   No results for input(s): LIPASE, AMYLASE in the last 168 hours. No results for input(s): AMMONIA in the last 168 hours. Coagulation Profile: Recent Labs  Lab 05/26/18 1908  INR 1.0   Cardiac Enzymes: No results for input(s): CKTOTAL, CKMB, CKMBINDEX, TROPONINI in the last 168 hours. BNP (last 3 results) No results for input(s): PROBNP in the last 8760 hours. HbA1C: No results for input(s): HGBA1C in the last 72 hours. CBG: No results for input(s): GLUCAP in the last 168 hours. Lipid Profile: No results for input(s): CHOL, HDL, LDLCALC, TRIG, CHOLHDL, LDLDIRECT in the last 72 hours. Thyroid Function Tests: No results for input(s): TSH, T4TOTAL, FREET4, T3FREE, THYROIDAB in the last 72 hours. Anemia Panel: Recent Labs    05/27/18 0344  VITAMINB12 787  FOLATE 42.8  FERRITIN 61  TIBC 277  IRON 33  RETICCTPCT 1.2   Urine analysis: No results found for: COLORURINE, APPEARANCEUR, LABSPEC, PHURINE, GLUCOSEU, HGBUR, BILIRUBINUR, KETONESUR, PROTEINUR, UROBILINOGEN, NITRITE, LEUKOCYTESUR Sepsis Labs: (procalcitonin:4,lacticidven:4)  ) Recent Results (from the past 240 hour(s))  Surgical pcr screen     Status: None   Collection Time: 05/27/18 12:27 AM  Result Value Ref Range Status   MRSA, PCR NEGATIVE NEGATIVE Final   Staphylococcus aureus NEGATIVE NEGATIVE Final    Comment: (NOTE) The Xpert SA Assay (FDA approved for NASAL specimens in patients 41 years of age and older), is one component of a comprehensive surveillance program. It is not intended to diagnose infection nor to guide or monitor  treatment. Performed at [facility], [address] [place], [place] [address]       Studies: Dg Chest 1 View  Result Date: 05/26/2018 CLINICAL DATA:  Fall, pain. EXAM: CHEST  1 VIEW COMPARISON:  None. FINDINGS: Heart size and mediastinal contours are within normal limits. Lungs are clear. No pleural effusion or pneumothorax seen. Osseous structures about the chest are unremarkable. IMPRESSION: No active disease. Electronically Signed   By: Bary Richard M.D.   On: 05/26/2018 20:35   Dg Pelvis 1-2 Views  Result Date: 05/26/2018 CLINICAL DATA:  Fall, pain. EXAM: PELVIS - 1-2 VIEW COMPARISON:  None. FINDINGS: Significantly displaced fracture within the subcapital region of the RIGHT femoral neck. No fractures seen within the adjacent osseous pelvis or about the LEFT hip. IMPRESSION: Significantly displaced fracture within the subcapital region of the RIGHT femoral neck. Electronically Signed   By: Bary Richard M.D.   On: 05/26/2018 20:34   Ct Head Wo Contrast  Result Date: 05/26/2018 CLINICAL DATA:  85 y/o  F; mechanical fall to the right-sided body. EXAM:  CT HEAD WITHOUT CONTRAST TECHNIQUE: Contiguous axial images were obtained from the base of the skull through the vertex without intravenous contrast. COMPARISON:  None. FINDINGS: Brain: No evidence of acute infarction, hemorrhage, hydrocephalus, extra-axial collection or mass lesion/mass effect. Nonspecific white matter hypodensities are compatible with mild chronic microvascular ischemic changes and there is mild volume loss of the brain for age. Vascular: Calcific atherosclerosis of the carotid siphons. No hyperdense vessel identified. Skull: Normal. Negative for fracture or focal lesion. Sinuses/Orbits: Small left maxillary and left posterior ethmoid air cell mucous retention cyst. Additional visible paranasal sinuses and the mastoid air cells are normally aerated. Other: None. IMPRESSION: 1. No acute intracranial abnormality identified.  2. Mild chronic microvascular ischemic changes and mild volume loss of the brain for age. Electronically Signed   By: Mitzi Hansen M.D.   On: 05/26/2018 21:41   Dg Femur, Min 2 Views Right  Result Date: 05/26/2018 CLINICAL DATA:  Fall, pain. EXAM: RIGHT FEMUR 2 VIEWS COMPARISON:  None. FINDINGS: Displaced fracture within the subcapital portion of the RIGHT femoral neck. Femoral head remains normally positioned relative to the acetabulum. Nearly 90 degree angulation deformity at the fracture site. Remainder of the RIGHT femur appears intact and normally aligned. IMPRESSION: Displaced fracture within the subcapital portion of the RIGHT femoral neck. Electronically Signed   By: Bary Richard M.D.   On: 05/26/2018 20:33    Scheduled Meds: . acetaminophen  325-650 mg Oral Once   Or  . acetaminophen (TYLENOL) oral liquid 160 mg/5 mL  325-650 mg Oral Once  . fentaNYL      . [MAR Hold] heparin  5,000 Units Subcutaneous Q8H    Continuous Infusions: . sodium chloride 75 mL/hr at 05/27/18 0400  . acetaminophen    . lactated ringers    . lactated ringers 50 mL/hr at 05/27/18 0836  . lactated ringers       LOS: 1 day     Myrtie Neither, MD [facility]  To reach me or the doctor on call, go to: [email or web address] Password [email or web address]  05/27/2018, 12:11 PM

## 2018-05-27 NOTE — Consult Note (Signed)
Orthopaedic Trauma Service (OTS) Consult   Patient ID: Lydia Klein MRN: [id number] DOB/AGE: 83-Jul-1935 83 y.o.   Reason for Consult: Right Femoral neck fracture  Referring Physician: Sanda Klein, MD    HPI: Lydia Klein is an 85 y.o. white female who sustained a ground-level fall yesterday evening.  Patient was outside at her home when the fall occurred.  Patient fell on her right side.  Had inability to get up and bear weight.  She attempted to call EMS however she was unable to get through to EMS.  Fortunately she was able to contact her son who was then able to contact EMS who then brought the patient to the hospital for evaluation.  Patient was found to have a right subcapital femoral neck fracture.  She was admitted to the hospitalist service and orthopedics was consulted for definitive treatment.  Patient seen and evaluated today she only really complains of some right hip pain but overall is comfortable.  Pain is exacerbated with movement in bed and movement of her right leg.  No other significant complaints are noted today.  Patient is independent prior to admission states that she does not use a cane or a walker prior to admission.  She essentially lives by herself in a double wide mobile home with approximately 3 steps to gain entry.  She states that on occasion her granddaughter and the granddaughter's boyfriend will stay with her.  Patient's only real medical problems include hypertension and some anxiety.  She is on medication for both of these.  Patient denies any numbness or tingling in her lower extremities, no chest pain or shortness of breath, no nausea or vomiting.  No dizziness no lightheadedness.  Past Medical History:  Diagnosis Date  . Hypertension     History reviewed. No pertinent surgical history.  History reviewed. No pertinent family history.  Social History:  reports that she has quit smoking. She has never used smokeless tobacco. She  reports previous alcohol use. She reports that she does not use drugs.  Allergies:  Allergies  Allergen Reactions  . Codeine Nausea And Vomiting  . Ibuprofen Swelling    Medications: I have reviewed the patient's current medications.  Current Meds  Medication Sig  . aspirin EC 81 MG tablet Take 81 mg by mouth daily.  . diazepam (VALIUM) 5 MG tablet Take 5 mg by mouth 2 (two) times daily.   Marland Kitchen spironolactone (ALDACTONE) 25 MG tablet Take 25 mg by mouth daily.     Results for orders placed or performed during the hospital encounter of 05/26/18 (from the past 48 hour(s))  Comprehensive metabolic panel     Status: Abnormal   Collection Time: 05/26/18  7:08 PM  Result Value Ref Range   Sodium 134 (L) 135 - 145 mmol/L   Potassium 4.5 3.5 - 5.1 mmol/L   Chloride 103 98 - 111 mmol/L   CO2 25 22 - 32 mmol/L   Glucose, Bld 135 (H) 70 - 99 mg/dL   BUN 18 8 - 23 mg/dL   Creatinine, Ser 6.96 (H) 0.44 - 1.00 mg/dL   Calcium 9.3 8.9 - 29.5 mg/dL   Total Protein 6.8 6.5 - 8.1 g/dL   Albumin 4.2 3.5 - 5.0 g/dL   AST 29 15 - 41 U/L   ALT 17 0 - 44 U/L   Alkaline Phosphatase 74 38 - 126 U/L   Total Bilirubin 0.4 0.3 - 1.2 mg/dL   GFR calc  non Af Amer 48 (L) >60 mL/min   GFR calc Af Amer 56 (L) >60 mL/min   Anion gap 6 5 - 15    Comment: Performed at [facility], [address], [place], [place] [address]  Protime-INR     Status: None   Collection Time: 05/26/18  7:08 PM  Result Value Ref Range   Prothrombin Time 13.4 11.4 - 15.2 seconds   INR 1.0 0.8 - 1.2    Comment: (NOTE) INR goal varies based on device and disease states. Performed at [facility], [address] [place], [place] [address]   CBC with Differential/Platelet     Status: Abnormal   Collection Time: 05/26/18  7:45 PM  Result Value Ref Range   WBC 8.4 4.0 - 10.5 K/uL   RBC 3.26 (L) 3.87 - 5.11 MIL/uL   Hemoglobin 9.7 (L) 12.0 - 15.0 g/dL   HCT 25.3 (L) 66.4 - 40.3 %   MCV 96.3 80.0 - 100.0 fL   MCH  29.8 26.0 - 34.0 pg   MCHC 30.9 30.0 - 36.0 g/dL   RDW 47.4 25.9 - 56.3 %   Platelets 147 (L) 150 - 400 K/uL    Comment: REPEATED TO VERIFY   nRBC 0.0 0.0 - 0.2 %   Neutrophils Relative % 86 %   Neutro Abs 7.3 1.7 - 7.7 K/uL   Lymphocytes Relative 6 %   Lymphs Abs 0.5 (L) 0.7 - 4.0 K/uL   Monocytes Relative 6 %   Monocytes Absolute 0.5 0.1 - 1.0 K/uL   Eosinophils Relative 1 %   Eosinophils Absolute 0.0 0.0 - 0.5 K/uL   Basophils Relative 0 %   Basophils Absolute 0.0 0.0 - 0.1 K/uL   Immature Granulocytes 1 %   Abs Immature Granulocytes 0.04 0.00 - 0.07 K/uL    Comment: Performed at [facility], [address] [place], [place] [address]  Surgical pcr screen     Status: None   Collection Time: 05/27/18 12:27 AM  Result Value Ref Range   MRSA, PCR NEGATIVE NEGATIVE   Staphylococcus aureus NEGATIVE NEGATIVE    Comment: (NOTE) The Xpert SA Assay (FDA approved for NASAL specimens in patients 32 years of age and older), is one component of a comprehensive surveillance program. It is not intended to diagnose infection nor to guide or monitor treatment. Performed at [facility], [address], [place], [place] [address]   CBC WITH DIFFERENTIAL     Status: Abnormal   Collection Time: 05/27/18  2:13 AM  Result Value Ref Range   WBC 8.1 4.0 - 10.5 K/uL    Comment: WHITE COUNT CONFIRMED ON SMEAR   RBC 3.53 (L) 3.87 - 5.11 MIL/uL   Hemoglobin 10.8 (L) 12.0 - 15.0 g/dL   HCT 18.8 (L) 41.6 - 60.6 %   MCV 93.2 80.0 - 100.0 fL   MCH 30.6 26.0 - 34.0 pg   MCHC 32.8 30.0 - 36.0 g/dL   RDW 30.1 60.1 - 09.3 %   Platelets 169 150 - 400 K/uL   nRBC 0.0 0.0 - 0.2 %   Neutrophils Relative % 93 %   Neutro Abs 7.5 1.7 - 7.7 K/uL   Lymphocytes Relative 3 %   Lymphs Abs 0.2 (L) 0.7 - 4.0 K/uL   Monocytes Relative 4 %   Monocytes Absolute 0.3 0.1 - 1.0 K/uL   Eosinophils Relative 0 %   Eosinophils Absolute 0.0 0.0 - 0.5 K/uL  Basophils Relative 0 %   Basophils Absolute 0.0  0.0 - 0.1 K/uL   nRBC 0 0 /100 WBC   Abs Immature Granulocytes 0.00 0.00 - 0.07 K/uL    Comment: Performed at [facility], [address] [place], [place] [address]  Comprehensive metabolic panel     Status: Abnormal   Collection Time: 05/27/18  2:13 AM  Result Value Ref Range   Sodium 135 135 - 145 mmol/L   Potassium 3.7 3.5 - 5.1 mmol/L    Comment: DELTA CHECK NOTED   Chloride 109 98 - 111 mmol/L   CO2 20 (L) 22 - 32 mmol/L   Glucose, Bld 134 (H) 70 - 99 mg/dL   BUN 17 8 - 23 mg/dL   Creatinine, Ser 1.02 0.44 - 1.00 mg/dL   Calcium 8.1 (L) 8.9 - 10.3 mg/dL   Total Protein 5.4 (L) 6.5 - 8.1 g/dL   Albumin 3.2 (L) 3.5 - 5.0 g/dL   AST 18 15 - 41 U/L   ALT 12 0 - 44 U/L   Alkaline Phosphatase 58 38 - 126 U/L   Total Bilirubin 0.9 0.3 - 1.2 mg/dL   GFR calc non Af Amer >60 >60 mL/min   GFR calc Af Amer >60 >60 mL/min   Anion gap 6 5 - 15    Comment: Performed at [facility], [address], [place], [place] [address]  Vitamin B12     Status: None   Collection Time: 05/27/18  3:44 AM  Result Value Ref Range   Vitamin B-12 787 180 - 914 pg/mL    Comment: (NOTE) This assay is not validated for testing neonatal or myeloproliferative syndrome specimens for Vitamin B12 levels. Performed at [facility], [address], [place], [place] [address]   Folate     Status: None   Collection Time: 05/27/18  3:44 AM  Result Value Ref Range   Folate 42.8 >5.9 ng/mL    Comment: RESULTS CONFIRMED BY MANUAL DILUTION Performed at [facility], [address], [place], [place] [address]   Iron and TIBC     Status: None   Collection Time: 05/27/18  3:44 AM  Result Value Ref Range   Iron 33 28 - 170 ug/dL   TIBC 956 387 - 564 ug/dL   Saturation Ratios 12 10.4 - 31.8 %   UIBC 244 ug/dL    Comment: Performed at [facility], [address], [place], [place] [address]  Ferritin     Status: None   Collection Time: 05/27/18  3:44 AM  Result Value Ref Range    Ferritin 61 11 - 307 ng/mL    Comment: Performed at [facility], [address], [place], [place] [address]  Reticulocytes     Status: Abnormal   Collection Time: 05/27/18  3:44 AM  Result Value Ref Range   Retic Ct Pct 1.2 0.4 - 3.1 %   RBC. 3.77 (L) 3.87 - 5.11 MIL/uL   Retic Count, Absolute 43.4 19.0 - 186.0 K/uL   Immature Retic Fract 5.3 2.3 - 15.9 %    Comment: Performed at [facility], [address], [place], [place] [address]    Dg Chest 1 View  Result Date: 05/26/2018 CLINICAL DATA:  Fall, pain. EXAM: CHEST  1 VIEW COMPARISON:  None. FINDINGS: Heart size and mediastinal contours are within normal limits. Lungs are clear. No pleural effusion or pneumothorax seen. Osseous structures about  the chest are unremarkable. IMPRESSION: No active disease. Electronically Signed   By: Bary Richard M.D.   On: 05/26/2018 20:35   Dg Pelvis 1-2 Views  Result Date: 05/26/2018 CLINICAL DATA:  Fall, pain. EXAM: PELVIS - 1-2 VIEW COMPARISON:  None. FINDINGS: Significantly displaced fracture within the subcapital region of the RIGHT femoral neck. No fractures seen within the adjacent osseous pelvis or about the LEFT hip. IMPRESSION: Significantly displaced fracture within the subcapital region of the RIGHT femoral neck. Electronically Signed   By: Bary Richard M.D.   On: 05/26/2018 20:34   Ct Head Wo Contrast  Result Date: 05/26/2018 CLINICAL DATA:  85 y/o  F; mechanical fall to the right-sided body. EXAM: CT HEAD WITHOUT CONTRAST TECHNIQUE: Contiguous axial images were obtained from the base of the skull through the vertex without intravenous contrast. COMPARISON:  None. FINDINGS: Brain: No evidence of acute infarction, hemorrhage, hydrocephalus, extra-axial collection or mass lesion/mass effect. Nonspecific white matter hypodensities are compatible with mild chronic microvascular ischemic changes and there is mild volume loss of the brain for age. Vascular: Calcific atherosclerosis of the  carotid siphons. No hyperdense vessel identified. Skull: Normal. Negative for fracture or focal lesion. Sinuses/Orbits: Small left maxillary and left posterior ethmoid air cell mucous retention cyst. Additional visible paranasal sinuses and the mastoid air cells are normally aerated. Other: None. IMPRESSION: 1. No acute intracranial abnormality identified. 2. Mild chronic microvascular ischemic changes and mild volume loss of the brain for age. Electronically Signed   By: Mitzi Hansen M.D.   On: 05/26/2018 21:41   Dg Femur, Min 2 Views Right  Result Date: 05/26/2018 CLINICAL DATA:  Fall, pain. EXAM: RIGHT FEMUR 2 VIEWS COMPARISON:  None. FINDINGS: Displaced fracture within the subcapital portion of the RIGHT femoral neck. Femoral head remains normally positioned relative to the acetabulum. Nearly 90 degree angulation deformity at the fracture site. Remainder of the RIGHT femur appears intact and normally aligned. IMPRESSION: Displaced fracture within the subcapital portion of the RIGHT femoral neck. Electronically Signed   By: Bary Richard M.D.   On: 05/26/2018 20:33    Review of Systems  Constitutional: Negative for chills and fever.  Respiratory: Negative for shortness of breath.   Cardiovascular: Negative for chest pain and palpitations.  Gastrointestinal: Negative for abdominal pain, nausea and vomiting.  Genitourinary: Negative for dysuria.  Musculoskeletal: Positive for joint pain (right hip pain).  Neurological: Negative for tingling and sensory change.   Blood pressure 131/60, pulse 97, temperature 99 F (37.2 C), temperature source Oral, resp. rate 18, height 5\' 3"  (1.6 m), weight 54.4 kg, SpO2 99 %. Physical Exam Vitals signs and nursing note reviewed.  Constitutional:      General: She is awake. She is not in acute distress.    Comments: Pleasant white female in no acute distress  Cardiovascular:     Rate and Rhythm: Normal rate and regular rhythm.  Pulmonary:      Comments: Breathing is unlabored, lungs are clear Abdominal:     Comments: Nontender, nondistended  Musculoskeletal:     Comments: Pelvis     no traumatic wounds or rash, no ecchymosis, stable to manual stress, nontender  Right lower extremity Inspection:    Hip is free of open wounds or lesions    Ecchymosis and subtle abrasion to the anterior right knee    No significant knee effusion appreciated     Lower leg is unremarkable Bony eval:    Right hip is tender to palpation  Right knee including distal femur, patella, proximal tibia and fibula are nontender    No crepitus or gross motion with manipulation of the right lower leg and ankle    Ankle and foot are nontender Soft tissue:    Expected swelling around the right hip    No traumatic or open wounds around the right hip    No significant swelling the distal leg    Thigh is soft    Abrasion and ecchymosis noted to the anterior right knee    No perceived knee instability noted with exam    Ankle is stable with evaluation Sensation:    DPN, SPN, TN sensory functions are intact Motor:    EHL, FHL, lesser toe motor functions are intact    Anterior tibialis, posterior tibialis peroneals gastrocsoleus complex motor functions are intact    Patient can perform a quad set Vascular:    + DP pulse    Extremity is warm    No deep calf tenderness    Compartments are soft and nontender    Soft tissue appears well perfused distally and has good coloration  Left Lower Extremity              no open wounds or lesions, no swelling or ecchymosis   Nontender hip, knee, ankle and foot             No crepitus or gross motion noted with manipulation of the Left leg  No knee or ankle effusion             No pain with axial loading or logrolling of the hip.  Knee stable to varus/ valgus and anterior/posterior stress             No pain with manipulation of the ankle or foot             No blocks to motion noted  Sens DPN, SPN, TN  intact  Motor EHL, FHL, lesser toe motor, Ext, flex, evers 5/5  DP 2+, No significant edema             Compartments are soft and nontender  Bilateral upper extremities     shoulder, elbow, wrist, digits- no skin wounds, nontender, no instability, no blocks to motion  Sens  Ax/R/M/U intact  Mot   Ax/ R/ PIN/ M/ AIN/ U intact  Rad 2+     Skin:    General: Skin is warm and dry.  Neurological:     Mental Status: She is alert and oriented to person, place, and time.     Comments: Unable to assess gait and coordination due to acute R femoral neck fracture   Psychiatric:        Attention and Perception: Attention normal.        Mood and Affect: Mood and affect normal.        Behavior: Behavior is cooperative.      Assessment/Plan:  85 year old female ground-level fall with right femoral neck fracture  -Fall  -Right subcapital femoral neck fracture  Recommend surgical intervention with right hip hemiarthroplasty.  Patient is in agreement with this plan.  Proceeding to the OR today   Patient will be weightbearing as tolerated postoperatively with posterior hip precautions   PT and OT evaluations postoperatively  May need short-term skilled nursing versus inpatient rehab as patient does not have dedicated 24-hour supervision at home  - Pain management:  Titrate accordingly, will minimize narcotics as much as possible  - ABL anemia/Hemodynamics  Currently stable but will monitor  - Medical issues   Per medical service  - DVT/PE prophylaxis:  Would recommend Lovenox for 4 weeks postoperatively - ID:   Perioperative antibiotics - Metabolic Bone Disease:  We will check metabolic bone labs as this fracture is suggestive of osteoporosis  Will need a bone density scan as an outpatient  - Activity:  PT and OT evaluations postop  Weight-bear as tolerated postop  - FEN/GI prophylaxis/Foley/Lines:  NPO for now  Advance diet post op   - Impediments to fracture  healing:  Suspected poor bone quality given the low energy mechanism  - Dispo:  OR today for right hip hemiarthroplasty    Post Op Plan   Weightbearing: WBAT RLE, Posterior hip precautions  Insicional and dressing care: Reinforce dressings as needed Orthopedic device(s): Walker  Showering: can shower once wounds are dry  VTE prophylaxis: Lovenox  qd 4 weeks  Pain control: tylenol, minimize narcotics  Follow - up plan: 2 weeks Contact information:  Myrene Galas MD, Montez Morita PA-C   Mearl Latin, PA-C [phone] (C) 05/27/2018, 8:38 AM  [facility] [address] [place] [address] [phone] Collier Bullock (F)

## 2018-05-27 NOTE — Anesthesia Preprocedure Evaluation (Addendum)
Anesthesia Evaluation  Patient identified by MRN, date of birth, ID band Patient awake    Reviewed: Allergy & Precautions, NPO status , Patient's Chart, lab work & pertinent test results  Airway Mallampati: I  TM Distance: >3 FB     Dental  (+) Edentulous Upper, Edentulous Lower   Pulmonary neg pulmonary ROS, former smoker,    breath sounds clear to auscultation       Cardiovascular hypertension,  Rhythm:Regular Rate:Normal     Neuro/Psych negative psych ROS   GI/Hepatic negative GI ROS, Neg liver ROS,   Endo/Other  negative endocrine ROS  Renal/GU negative Renal ROS     Musculoskeletal negative musculoskeletal ROS (+)   Abdominal Normal abdominal exam  (+)   Peds  Hematology negative hematology ROS (+)   Anesthesia Other Findings   Reproductive/Obstetrics                            Lab Results  Component Value Date   WBC 8.1 05/27/2018   HGB 10.8 (L) 05/27/2018   HCT 32.9 (L) 05/27/2018   MCV 93.2 05/27/2018   PLT 169 05/27/2018   Lab Results  Component Value Date   CREATININE 0.84 05/27/2018   BUN 17 05/27/2018   NA 135 05/27/2018   K 3.7 05/27/2018   CL 109 05/27/2018   CO2 20 (L) 05/27/2018   Lab Results  Component Value Date   INR 1.0 05/26/2018   EKG: normal sinus rhythm.   Anesthesia Physical Anesthesia Plan  ASA: III  Anesthesia Plan: General   Post-op Pain Management:    Induction: Intravenous  PONV Risk Score and Plan: 4 or greater and Ondansetron and Treatment may vary due to age or medical condition  Airway Management Planned: Oral ETT  Additional Equipment: None  Intra-op Plan:   Post-operative Plan: Extubation in OR  Informed Consent: I have reviewed the patients History and Physical, chart, labs and discussed the procedure including the risks, benefits and alternatives for the proposed anesthesia with the patient or authorized representative  who has indicated his/her understanding and acceptance.       Plan Discussed with: CRNA  Anesthesia Plan Comments:        Anesthesia Quick Evaluation

## 2018-05-27 NOTE — Anesthesia Postprocedure Evaluation (Signed)
Anesthesia Post Note  Patient: Lydia Klein  Procedure(s) Performed: ARTHROPLASTY BIPOLAR HIP (HEMIARTHROPLASTY) (Right Hip)     Patient location during evaluation: PACU Anesthesia Type: General Level of consciousness: awake and alert Pain management: pain level controlled Vital Signs Assessment: post-procedure vital signs reviewed and stable Respiratory status: spontaneous breathing, nonlabored ventilation and respiratory function stable Cardiovascular status: blood pressure returned to baseline and stable Postop Assessment: no apparent nausea or vomiting Anesthetic complications: no    Last Vitals:  Vitals:   05/27/18 1215 05/27/18 1230  BP: (!) 127/57 125/67  Pulse: 75 75  Resp: 20 20  Temp:    SpO2: 93% 96%    Last Pain:  Vitals:   05/27/18 1215  TempSrc:   PainSc: 0-No pain                 Mckoy Bhakta A.

## 2018-05-27 NOTE — Plan of Care (Signed)

## 2018-05-27 NOTE — Anesthesia Procedure Notes (Signed)
Procedure Name: Intubation Date/Time: 05/27/2018 9:23 AM Performed by: Modena Morrow, CRNA Pre-anesthesia Checklist: Patient identified, Emergency Drugs available, Suction available, Patient being monitored and Timeout performed Patient Re-evaluated:Patient Re-evaluated prior to induction Preoxygenation: Pre-oxygenation with 100% oxygen Induction Type: IV induction Ventilation: Mask ventilation without difficulty Laryngoscope Size: Miller and 2 Grade View: Grade I Tube type: Oral Tube size: 7.0 mm Number of attempts: 1 Airway Equipment and Method: Stylet Secured at: 21 cm Tube secured with: Tape Dental Injury: Teeth and Oropharynx as per pre-operative assessment

## 2018-05-27 NOTE — Transfer of Care (Signed)
Immediate Anesthesia Transfer of Care Note  Patient: Lydia Klein  Procedure(s) Performed: ARTHROPLASTY BIPOLAR HIP (HEMIARTHROPLASTY) (Right Hip)  Patient Location: PACU  Anesthesia Type:General  Level of Consciousness: drowsy and patient cooperative  Airway & Oxygen Therapy: Patient Spontanous Breathing and Patient connected to face mask oxygen  Post-op Assessment: Report given to RN and Post -op Vital signs reviewed and stable  Post vital signs: Reviewed and stable  Last Vitals:  Vitals Value Taken Time  BP 126/62 05/27/2018 11:45 AM  Temp    Pulse 83 05/27/2018 11:48 AM  Resp 24 05/27/2018 11:48 AM  SpO2 92 % 05/27/2018 11:48 AM  Vitals shown include unvalidated device data.  Last Pain:  Vitals:   05/27/18 0741  TempSrc:   PainSc: 5       Patients Stated Pain Goal: 3 (05/27/18 0741)  Complications: No apparent anesthesia complications

## 2018-05-27 NOTE — Op Note (Signed)
05/27/2018  11:57 AM  PATIENT:  Lydia Klein  85 y.o. female  PRE-OPERATIVE DIAGNOSIS:  Right femoral neck fracture  POST-OPERATIVE DIAGNOSIS:  Right femoral neck fracture  PROCEDURE:  Procedure(s): ARTHROPLASTY UNIPOLAR HIP (HEMIARTHROPLASTY) (Right) with DePuy [place] high demand #2 stem, standard neck, and 77mm head  SURGEON:  Surgeon(s) and Role:    Myrene Galas, MD - Primary  PHYSICIAN ASSISTANT: 1. Montez Morita, PA-C; 2. PA student  ANESTHESIA:   general  EBL:  50 mL   BLOOD ADMINISTERED:none  DRAINS: none   LOCAL MEDICATIONS USED:  NONE  SPECIMEN:  No Specimen  DISPOSITION OF SPECIMEN:  N/A  COUNTS:  YES  TOURNIQUET:  * No tourniquets in log *  DICTATION: .Note written in EPIC  PLAN OF CARE: Admit to inpatient   PATIENT DISPOSITION:  PACU - hemodynamically stable.   Delay start of Pharmacological VTE agent (>24hrs) due to surgical blood loss or risk of bleeding: no  BRIEF SUMMARY OF INDICATION FOR PROCEDURE:  Lydia Klein is a very Pleasant and active 85 y.o., who sustained a fall producing inability to bear weight, shortening, and external rotation of the extremity.  She was seen and evaluated with the recommendation for hemiarthroplasty. I discussed with the patient risks and benefits, inclding the potential for leg length inequality, dislocation or instability, arthritis, loss of motion, DVT, PE, heart attack, stroke, and death.  Consent was given to proceed.  BRIEF SUMMARY OF PROCEDURE:  The patient was taken to the operating room where general anesthesia was induced and after administration of preoperative antibiotics consisting of 2 g of Ancef.  She was positioned with the right side up and all prominences were padded appropriately.  We made a 10 cm incision after the time-out, carrying dissection down to the IT band, was split in line with the skin.  Cerebellar retractor was placed and we were able to then flex and internally rotate the  hip releasing the piriformis and short rotators at their insertions.  The capsule was then T'd, tagging the corners with #1 Vicryl.  The neck cut was refined using a cutting guide and then this was followed by removal of the head, which sized perfectly to 43 mm. Acetabular trials were placed, confirming this size as the best fit. Mueller and Cobra retractors were placed along the proximal femur, which was then prepared with the canal finder, then lateralizer, followed by reamers up to #2, and the broaches, achieving  outstanding fit and fill with the #2 broach.  The calcar reamer was used to refine the cut.  The acetabulum and soft tissues were irrigated thoroughly and the acetabulum once again searched multiple times for fragments and irrigated thoroughly.  Trial components were placed and the patient had outstanding stability in combined 90 degrees of flexion, adduction, and internal rotation as well as in external rotation and extension.  Consequently, actual components were placed.  My assistant Montez Morita, was necessary for delivery and control of the proximal femur during preparation, also during relocation and dislocation of the trial components as well as relocation of the actual components.  He assisted me with wound closure as well.  I did repair the capsule with #1 Vicryl and then used #2 FiberWire through bone tunnels to repair the short rotators and piriformis.  This was followed by a #1 Vicryl for the IT band and lastly 2-0 Vicryl and nylon for the subcutaneous and skin.  Sterile gently compressive dressing was applied.  The patient was awakened  from anesthesia and transported to the PACU in stable condition.  PROGNOSIS:  The patient will be weightbearing as tolerated with posterior hip precautions, with early and aggressive therapy encouraged to maintain her overall health and mobility.  Lydia Klein remains on the Medical Service and will be on DVT prophylaxis mechanically and  with Lovenox.   Lydia Klein. Lydia Klein, M.D.

## 2018-05-27 NOTE — Progress Notes (Signed)
Initial Nutrition Assessment  DOCUMENTATION CODES:   Not applicable  INTERVENTION:    Ensure Enlive po BID, each supplement provides 350 kcal and 20 grams of protein  NUTRITION DIAGNOSIS:   Increased nutrient needs related to wound healing, hip fracture as evidenced by estimated needs.  GOAL:   Patient will meet greater than or equal to 90% of their needs  MONITOR:   PO intake, Supplement acceptance, Skin  REASON FOR ASSESSMENT:   Consult Hip fracture protocol  ASSESSMENT:   85 yo female with PMH of HTN who was admitted with R hip fracture s/p fall at home. S/P hemiarthroplasty 3/6.   Patient and family reports that she has been eating well at home. She grazes throughout the day. Weight has been stable. Patient agreed to drink Ensure Enlive supplements BID between meals to maximize protein and calorie intake.   Labs and medications reviewed. NFPE completed. Suspect depletions are related to advanced age.   NUTRITION - FOCUSED PHYSICAL EXAM:    Most Recent Value  Orbital Region  No depletion  Upper Arm Region  Mild depletion  Thoracic and Lumbar Region  No depletion  Buccal Region  Mild depletion  Temple Region  Mild depletion  Clavicle Bone Region  Mild depletion  Clavicle and Acromion Bone Region  Mild depletion  Scapular Bone Region  No depletion  Dorsal Hand  Moderate depletion  Patellar Region  Mild depletion  Anterior Thigh Region  Mild depletion  Posterior Calf Region  Mild depletion  Edema (RD Assessment)  None  Hair  Reviewed  Eyes  Reviewed  Mouth  Reviewed  Skin  Reviewed  Nails  Reviewed       Diet Order:   Diet Order            Diet regular Room service appropriate? Yes; Fluid consistency: Thin  Diet effective now              EDUCATION NEEDS:   No education needs have been identified at this time  Skin:  Skin Assessment: Reviewed RN Assessment  Last BM:  no BM documented since admission  Height:   Ht Readings from Last 1  Encounters:  05/27/18 5\' 3"  (1.6 m)    Weight:   Wt Readings from Last 1 Encounters:  05/27/18 54.4 kg    Ideal Body Weight:  52.3 kg  BMI:  Body mass index is 21.26 kg/m.  Estimated Nutritional Needs:   Kcal:  1400-1600  Protein:  70-80 gm  Fluid:  1.5 L    Joaquin Courts, RD, LDN, CNSC Pager [phone] After Hours Pager [phone]

## 2018-05-28 LAB — CBC
HCT: 30.1 % — ABNORMAL LOW (ref 36.0–46.0)
Hemoglobin: 9.9 g/dL — ABNORMAL LOW (ref 12.0–15.0)
MCH: 31 pg (ref 26.0–34.0)
MCHC: 32.9 g/dL (ref 30.0–36.0)
MCV: 94.4 fL (ref 80.0–100.0)
Platelets: 175 10*3/uL (ref 150–400)
RBC: 3.19 MIL/uL — ABNORMAL LOW (ref 3.87–5.11)
RDW: 13 % (ref 11.5–15.5)
WBC: 8.6 10*3/uL (ref 4.0–10.5)
nRBC: 0 % (ref 0.0–0.2)

## 2018-05-28 LAB — BASIC METABOLIC PANEL
Anion gap: 10 (ref 5–15)
BUN: 16 mg/dL (ref 8–23)
CO2: 24 mmol/L (ref 22–32)
CREATININE: 0.95 mg/dL (ref 0.44–1.00)
Calcium: 8.4 mg/dL — ABNORMAL LOW (ref 8.9–10.3)
Chloride: 103 mmol/L (ref 98–111)
GFR calc Af Amer: >60 mL/min (ref >60)
GFR calc non Af Amer: 56 mL/min — ABNORMAL LOW (ref >60)
Glucose, Bld: 127 mg/dL — ABNORMAL HIGH (ref 70–99)
Potassium: 4.1 mmol/L (ref 3.5–5.1)
Sodium: 137 mmol/L (ref 135–145)

## 2018-05-28 MED ORDER — NALOXONE HCL 0.4 MG/ML IJ SOLN
INTRAMUSCULAR | Status: AC
  Filled 2018-05-28: qty 1

## 2018-05-28 NOTE — Progress Notes (Signed)
PROGRESS NOTE  Lydia Klein WNI:[id number] DOB: 01-Jul-1933 DOA: 05/26/2018 PCP: Elisabeth Most, FNP  HPI/Recap of past 24 hours: This is an 85 year old female with past medical history of hypertension anxiety who had an accidental fall at home landing on her left right side she has an underlying dementia.  She sustained a right displaced femoral neck fracture.  Subjective: Patient seen immediate postop at her room.  Complaint she feels tired  May 28, 2018: Subjective: Patient seen and examined at bedside she stated she is feeling a lot better today still has pain but is tolerable    Assessment/Plan: Principal Problem:   Closed right hip fracture, initial encounter [facility]) Active Problems:   Hypertension   Hyponatremia   Normocytic anemia   Anxiety   1.  Fall at home  2.  Closed right displaced femoral neck fracture disposed.  Patient underwent physical therapy today and they are recommending SNF.  3.Hypertension controlled continue metoprolol  4.  Mild hyponatremia will replace with normal saline infusion  5.  Normocytic anemia we will continue to monitor H&H  Code Status: Full  Severity of Illness: The appropriate patient status for this patient is INPATIENT. Inpatient status is judged to be reasonable and necessary in order to provide the required intensity of service to ensure the patient's safety. The patient's presenting symptoms, physical exam findings, and initial radiographic and laboratory data in the context of their chronic comorbidities is felt to place them at high risk for further clinical deterioration. Furthermore, it is not anticipated that the patient will be medically stable for discharge from the hospital within 2 midnights of admission. The following factors support the patient status of inpatient.   " The patient's presenting symptoms include fall with right femoral neck fracture status post right hemiarthroplasty " The worrisome physical exam  findings include . " The initial radiographic and laboratory data are worrisome because of displaced fracture of the femoral neck.    * I certify that at the point of admission it is my clinical judgment that the patient will require inpatient hospital care spanning beyond 2 midnights from the point of admission due to high intensity of service, high risk for further deterioration and high frequency of surveillance required.*    Family Communication: None at bedside  Disposition Plan: Home when stable   Consultants:  Orthopedic surgery  Procedures:  right hemiarthroplasty PROCEDURE:  Procedure(s):  ARTHROPLASTY UNIPOLAR HIP (HEMIARTHROPLASTY) (Right) with DePuy [place] high demand #2 stem, standard neck, and 59mm head  Antimicrobials:  None  DVT prophylaxis: Subacute heparin   Objective: Vitals:   05/27/18 1353 05/27/18 2010 05/28/18 1420 05/28/18 1421  BP: (!) 120/50 (!) 110/52 (!) 108/45   Pulse: 79 87 83   Resp: 16 18 16    Temp: 98.1 F (36.7 C) 98.4 F (36.9 C) 98.4 F (36.9 C)   TempSrc: Oral Oral Oral   SpO2: 96% 96% 90% 96%  Weight:      Height:        Intake/Output Summary (Last 24 hours) at 05/28/2018 1918 Last data filed at 05/28/2018 1500 Gross per 24 hour  Intake 480 ml  Output 900 ml  Net -420 ml   Filed Weights   05/27/18 0741  Weight: 54.4 kg   Body mass index is 21.26 kg/m.  Exam:  . General: 85 y.o. year-old female well developed well nourished in no acute distress.  Alert and oriented x3.  Patient is more awake she looks a whole lot better  pleasant . Cardiovascular: Regular rate and rhythm with no rubs or gallops.  No thyromegaly or JVD noted.   Marland Kitchen Respiratory: Clear to auscultation with no wheezes or rales. Good inspiratory effort. . Abdomen: Soft nontender nondistended with normal bowel sounds x4 quadrants. . Musculoskeletal: No lower extremity edema. 2/4 pulses in all 4 extremities. . Skin: No ulcerative lesions noted or  rashes, Psychiatry: Mood and affect is appropriate   Data Reviewed: CBC: Recent Labs  Lab 05/26/18 1945 05/27/18 0213 05/27/18 1341 05/28/18 0404  WBC 8.4 8.1 10.6* 8.6  NEUTROABS 7.3 7.5  --   --   HGB 9.7* 10.8* 12.0 9.9*  HCT 31.4* 32.9* 37.7 30.1*  MCV 96.3 93.2 94.3 94.4  PLT 147* 169 174 175   Basic Metabolic Panel: Recent Labs  Lab 05/26/18 1908 05/27/18 0213 05/27/18 1341 05/28/18 0404  NA 134* 135  --  137  K 4.5 3.7  --  4.1  CL 103 109  --  103  CO2 25 20*  --  24  GLUCOSE 135* 134*  --  127*  BUN 18 17  --  16  CREATININE 1.07* 0.84 0.95 0.95  CALCIUM 9.3 8.1*  --  8.4*   GFR: Estimated Creatinine Clearance: 37.8 mL/min (by C-G formula based on SCr of 0.95 mg/dL). Liver Function Tests: Recent Labs  Lab 05/26/18 1908 05/27/18 0213  AST 29 18  ALT 17 12  ALKPHOS 74 58  BILITOT 0.4 0.9  PROT 6.8 5.4*  ALBUMIN 4.2 3.2*   No results for input(s): LIPASE, AMYLASE in the last 168 hours. No results for input(s): AMMONIA in the last 168 hours. Coagulation Profile: Recent Labs  Lab 05/26/18 1908  INR 1.0   Cardiac Enzymes: No results for input(s): CKTOTAL, CKMB, CKMBINDEX, TROPONINI in the last 168 hours. BNP (last 3 results) No results for input(s): PROBNP in the last 8760 hours. HbA1C: No results for input(s): HGBA1C in the last 72 hours. CBG: No results for input(s): GLUCAP in the last 168 hours. Lipid Profile: No results for input(s): CHOL, HDL, LDLCALC, TRIG, CHOLHDL, LDLDIRECT in the last 72 hours. Thyroid Function Tests: No results for input(s): TSH, T4TOTAL, FREET4, T3FREE, THYROIDAB in the last 72 hours. Anemia Panel: Recent Labs    05/27/18 0344  VITAMINB12 787  FOLATE 42.8  FERRITIN 61  TIBC 277  IRON 33  RETICCTPCT 1.2   Urine analysis: No results found for: COLORURINE, APPEARANCEUR, LABSPEC, PHURINE, GLUCOSEU, HGBUR, BILIRUBINUR, KETONESUR, PROTEINUR, UROBILINOGEN, NITRITE, LEUKOCYTESUR Sepsis  Labs: (procalcitonin:4,lacticidven:4)  ) Recent Results (from the past 240 hour(s))  Surgical pcr screen     Status: None   Collection Time: 05/27/18 12:27 AM  Result Value Ref Range Status   MRSA, PCR NEGATIVE NEGATIVE Final   Staphylococcus aureus NEGATIVE NEGATIVE Final    Comment: (NOTE) The Xpert SA Assay (FDA approved for NASAL specimens in patients 36 years of age and older), is one component of a comprehensive surveillance program. It is not intended to diagnose infection nor to guide or monitor treatment. Performed at [facility], [address] [place], [place] [address]       Studies: No results found.  Scheduled Meds: . acetaminophen  500 mg Oral Q8H  . docusate sodium  100 mg Oral BID  . enoxaparin (LOVENOX) injection  40 mg Subcutaneous Q24H  . feeding supplement (ENSURE ENLIVE)  237 mL Oral BID BM    Continuous Infusions: . sodium chloride 75 mL/hr at 05/27/18 1856  LOS: 2 days     Myrtie Neither, MD [facility]  To reach me or the doctor on call, go to: [email or web address] Password [facility]  05/28/2018, 7:18 PM

## 2018-05-28 NOTE — Progress Notes (Signed)
SPORTS MEDICINE AND JOINT REPLACEMENT  Georgena Spurling, MD    Laurier Nancy, PA-C [address] [place], [place], [place]  [address]                             [phone]   PROGRESS NOTE  Subjective:  negative for Chest Pain  negative for Shortness of Breath  negative for Nausea/Vomiting   negative for Calf Pain  negative for Bowel Movement   Tolerating Diet: yes         Patient reports pain as 3 on 0-10 scale.    Objective: Vital signs in last 24 hours:    Patient Vitals for the past 24 hrs:  BP Temp Temp src Pulse Resp SpO2  05/27/18 2010 (!) 110/52 98.4 F (36.9 C) Oral 87 18 96 %  05/27/18 1353 (!) 120/50 98.1 F (36.7 C) Oral 79 16 96 %  05/27/18 1230 125/67 - - 75 20 96 %  05/27/18 1215 (!) 127/57 - - 75 20 93 %  05/27/18 1200 (!) 118/59 - - 75 12 94 %  05/27/18 1145 126/62 97.7 F (36.5 C) - 88 14 98 %    @flow {1959:LAST@   Intake/Output from previous day:   03/06 0701 - 03/07 0700 In: 1975.3 [P.O.:240; I.V.:1635.3] Out: 1750 [Urine:1700]   Intake/Output this shift:   No intake/output data recorded.   Intake/Output      03/06 0701 - 03/07 0700 03/07 0701 - 03/08 0700   P.O. 240    I.V. (mL/kg) 1635.3 (30.1)    IV Piggyback 100    Total Intake(mL/kg) 1975.3 (36.3)    Urine (mL/kg/hr) 1700 (1.3)    Blood 50    Total Output 1750    Net +225.3            LABORATORY DATA: Recent Labs    05/26/18 1945 05/27/18 0213 05/27/18 1341 05/28/18 0404  WBC 8.4 8.1 10.6* 8.6  HGB 9.7* 10.8* 12.0 9.9*  HCT 31.4* 32.9* 37.7 30.1*  PLT 147* 169 174 175   Recent Labs    05/26/18 1908 05/27/18 0213 05/27/18 1341 05/28/18 0404  NA 134* 135  --  137  K 4.5 3.7  --  4.1  CL 103 109  --  103  CO2 25 20*  --  24  BUN 18 17  --  16  CREATININE 1.07* 0.84 0.95 0.95  GLUCOSE 135* 134*  --  127*  CALCIUM 9.3 8.1*  --  8.4*   Lab Results  Component Value Date   INR 1.0 05/26/2018    Examination:  General appearance: alert, cooperative and no  distress Extremities: extremities normal, atraumatic, no cyanosis or edema  Wound Exam: clean, dry, intact   Drainage:  None: wound tissue dry  Motor Exam: Quadriceps and Hamstrings Intact  Sensory Exam: Superficial Peroneal, Deep Peroneal and Tibial normal   Assessment:    1 Day Post-Op  Procedure(s) (LRB): ARTHROPLASTY BIPOLAR HIP (HEMIARTHROPLASTY) (Right)  ADDITIONAL DIAGNOSIS:  Principal Problem:   Closed right hip fracture, initial encounter (HCC) Active Problems:   Hypertension   Hyponatremia   Normocytic anemia   Anxiety     Plan: Physical Therapy as ordered Weight Bearing as Tolerated (WBAT)  DVT Prophylaxis:  Lovenox   Patient doing well and resting comfortably. Continue to progress with PT. Medicine following  Guy Sandifer 05/28/2018, 7:48 AM

## 2018-05-28 NOTE — Evaluation (Signed)
Physical Therapy Evaluation Patient Details Name: Lydia Klein MRN: [id number] DOB: 1933/09/26 Today's Date: 05/28/2018   History of Present Illness  SHERIKA KUBICKI is a 85 y.o. female with medical history significant of hypertension, anxiety who had an accidental fall at home landing on her right side, developing pain immediately and inability to get up. Sustained R hip and pelvic fx and underwent R unipolar hip hemiarthroplasty by Dr Carola Frost on 05/27/18.   Clinical Impression  Pt admitted with above diagnosis. Pt currently with functional limitations due to the deficits listed below (see PT Problem List). Pt required max A for bed mobility and mod A for pivot to chair with RW. Pt with decreased responsiveness once in chair and SpO2 80%. 2L O2 applied and Spo2 94% with increased responsiveness.  Pt will benefit from skilled PT to increase their independence and safety with mobility to allow discharge to the venue listed below.       Follow Up Recommendations SNF;Supervision/Assistance - 24 hour    Equipment Recommendations  Other (comment)(TBD)    Recommendations for Other Services       Precautions / Restrictions Precautions Precautions: Fall;Posterior Hip Precaution Booklet Issued: Yes (comment) Precaution Comments: precautions reviewed but pt needs reteaching Required Braces or Orthoses: Knee Immobilizer - Right Knee Immobilizer - Right: Other (comment)(in bed) Restrictions Weight Bearing Restrictions: Yes RLE Weight Bearing: Weight bearing as tolerated Other Position/Activity Restrictions: watch SpO2      Mobility  Bed Mobility Overal bed mobility: Needs Assistance Bed Mobility: Supine to Sit     Supine to sit: Max assist     General bed mobility comments: max A to pivot to EOB and once pt up, strong L lean away from hurting side, could not self correct with cues  Transfers Overall transfer level: Needs assistance Equipment used: Rolling walker (2  wheeled) Transfers: Sit to/from [employer] Sit to Stand: Mod assist Stand pivot transfers: Mod assist       General transfer comment: vc's for sequencing of moving RW and stepping feet. Pt had difficulty grasping RW with L hand. Strong vc's needed for pt to back up to chair before sitting.   Ambulation/Gait             General Gait Details: unable, avoidant of putting wt on RLE today to step LLE  Stairs            Wheelchair Mobility    Modified Rankin (Stroke Patients Only)       Balance Overall balance assessment: Needs assistance;History of Falls Sitting-balance support: Bilateral upper extremity supported Sitting balance-Leahy Scale: Poor Sitting balance - Comments: needed min A due to L lean Postural control: Left lateral lean Standing balance support: Bilateral upper extremity supported Standing balance-Leahy Scale: Poor Standing balance comment: mod A needed to maintain standing                             Pertinent Vitals/Pain Pain Assessment: Faces Faces Pain Scale: Hurts even more Pain Location: R hip with mvmt Pain Descriptors / Indicators: Aching;Operative site guarding;Grimacing;Guarding Pain Intervention(s): Limited activity within patient's tolerance;Monitored during session    Home Living Family/patient expects to be discharged to:: Skilled nursing facility Living Arrangements: Alone Available Help at Discharge: Family;Available PRN/intermittently                  Prior Function Level of Independence: Independent  Hand Dominance        Extremity/Trunk Assessment   Upper Extremity Assessment Upper Extremity Assessment: Defer to OT evaluation    Lower Extremity Assessment Lower Extremity Assessment: RLE deficits/detail RLE Deficits / Details: hip flex 2-/5, knee ext 2/5 due to pain RLE: Unable to fully assess due to pain RLE Sensation: WNL RLE Coordination: decreased gross  motor    Cervical / Trunk Assessment Cervical / Trunk Assessment: Kyphotic  Communication   Communication: No difficulties  Cognition Arousal/Alertness: Awake/alert Behavior During Therapy: WFL for tasks assessed/performed Overall Cognitive Status: No family/caregiver present to determine baseline cognitive functioning Area of Impairment: Memory;Following commands;Awareness;Problem solving;Safety/judgement                     Memory: Decreased short-term memory;Decreased recall of precautions Following Commands: Follows one step commands inconsistently;Follows one step commands with increased time Safety/Judgement: Decreased awareness of deficits;Decreased awareness of safety Awareness: Emergent Problem Solving: Slow processing;Decreased initiation;Requires verbal cues;Requires tactile cues;Difficulty sequencing General Comments: pt unable to figure out how to use phone in room, when RN dialed for her she held her L hand between her mouth and the phone making it hard for person on phone to hear her. Had difficulty following commands that involved her using her L hand. Slow processing      General Comments General comments (skin integrity, edema, etc.): once pt sat in chair she had difficulty getting L hand up onto chair arm, hand had to be placed for her. After that, she began shaking above waist and was not responding. RN notified and brought O2 as well as nutrition arrived with pt's tray. Pt restarted responding after ~4 mins. SpO2 80% before O2 brought in, up to 94% on 2L.     Exercises General Exercises - Lower Extremity Ankle Circles/Pumps: AROM;Both;10 reps;Seated   Assessment/Plan    PT Assessment Patient needs continued PT services  PT Problem List Decreased strength;Decreased range of motion;Decreased activity tolerance;Decreased balance;Decreased mobility;Decreased coordination;Decreased cognition;Decreased knowledge of use of DME;Decreased knowledge of  precautions;Decreased safety awareness;Pain;Cardiopulmonary status limiting activity       PT Treatment Interventions DME instruction;Gait training;Functional mobility training;Therapeutic activities;Therapeutic exercise;Balance training;Neuromuscular re-education;Cognitive remediation;Patient/family education    PT Goals (Current goals can be found in the Care Plan section)  Acute Rehab PT Goals Patient Stated Goal: get better PT Goal Formulation: With patient Time For Goal Achievement: 06/11/18 Potential to Achieve Goals: Fair    Frequency Min 3X/week   Barriers to discharge Decreased caregiver support lives alone    Co-evaluation               AM-PAC PT "6 Clicks" Mobility  Outcome Measure Help needed turning from your back to your side while in a flat bed without using bedrails?: A Lot Help needed moving from lying on your back to sitting on the side of a flat bed without using bedrails?: A Lot Help needed moving to and from a bed to a chair (including a wheelchair)?: A Lot Help needed standing up from a chair using your arms (e.g., wheelchair or bedside chair)?: A Lot Help needed to walk in hospital room?: Total Help needed climbing 3-5 steps with a railing? : Total 6 Click Score: 10    End of Session Equipment Utilized During Treatment: Gait belt;Oxygen Activity Tolerance: Treatment limited secondary to medical complications (Comment)(decreased responsiveness after transfer and desat) Patient left: in chair;with chair alarm set;with call bell/phone within reach Nurse Communication: Mobility status;Other (comment)(SpO2) PT Visit Diagnosis: Unsteadiness on  feet (R26.81);Muscle weakness (generalized) (M62.81);History of falling (Z91.81);Difficulty in walking, not elsewhere classified (R26.2);Pain Pain - Right/Left: Right Pain - part of body: Hip    Time: 4010-2725 PT Time Calculation (min) (ACUTE ONLY): 32 min   Charges:   PT Evaluation $PT Eval Moderate  Complexity: 1 Mod PT Treatments $Neuromuscular Re-education: 8-22 mins        Lyanne Co, PT  Acute Rehab Services  Pager [phone] Office [phone]   Lawana Chambers Neve Branscomb 05/28/2018, 2:14 PM

## 2018-05-28 NOTE — Social Work (Signed)
CSW acknowledging consult for SNF placement. Will follow for therapy recommendations.   Perri Lamagna, MSW, LCSWA [facility] Clinical Social Work [phone]   

## 2018-05-28 NOTE — [place] FL2 (Signed)
[place] MEDICAID FL2 LEVEL OF CARE SCREENING TOOL     IDENTIFICATION  Patient Name: Lydia Klein Birthdate: [birth year] Sex: female Admission Date (Current Location): 05/26/2018  [facility] and [place] Number:  Producer, television/film/video and Address:  [facility] [place], [address], [place], [place] [address]      Provider Number: [id number]  Attending Physician Name and Address:  Myrtie Neither, MD  Relative Name and Phone Number:   [name]; daughter; [phone]    Current Level of Care: Hospital Recommended Level of Care: Skilled Nursing Facility Prior Approval Number:    Date Approved/Denied:   PASRR Number: [id number]  Discharge Plan: SNF    Current Diagnoses: Patient Active Problem List   Diagnosis Date Noted  . Anxiety 05/27/2018  . Closed right hip fracture, initial encounter (HCC) 05/26/2018  . Hypertension 05/26/2018  . Hyponatremia 05/26/2018  . Normocytic anemia 05/26/2018    Orientation RESPIRATION BLADDER Height & Weight     Self, Time, Place, Situation  O2(2L nasal canula) Continent, External catheter Weight: 120 lb (54.4 kg) Height:  5\' 3"  (160 cm)  BEHAVIORAL SYMPTOMS/MOOD NEUROLOGICAL BOWEL NUTRITION STATUS      Continent Diet(see discharge summary)  AMBULATORY STATUS COMMUNICATION OF NEEDS Skin   Extensive Assist Verbally Surgical wounds(incision on right hip with adhesive bandage; abrasion on right knee; ecchymosis on arms and legs)                       Personal Care Assistance Level of Assistance  Bathing, Feeding, Dressing Bathing Assistance: Maximum assistance Feeding assistance: Independent Dressing Assistance: Maximum assistance     Functional Limitations Info  Sight, Hearing, Speech Sight Info: Adequate Hearing Info: Adequate Speech Info: Adequate    SPECIAL CARE FACTORS FREQUENCY  PT (By licensed PT), OT (By licensed OT)     PT Frequency: 5x week OT Frequency: 5x week            Contractures  Contractures Info: Not present    Additional Factors Info  Code Status, Allergies Code Status Info: Full Code Allergies Info: CODEINE, IBUPROFEN            Current Medications (05/28/2018):  This is the current hospital active medication list Current Facility-Administered Medications  Medication Dose Route Frequency Provider Last Rate Last Dose  . 0.9 %  sodium chloride infusion   Intravenous Continuous Montez Morita, PA-C 75 mL/hr at 05/27/18 1856    . acetaminophen (TYLENOL) tablet 500 mg  500 mg Oral Q8H Myrtie Neither, MD   500 mg at 05/28/18 0426  . docusate sodium (COLACE) capsule 100 mg  100 mg Oral BID Montez Morita, PA-C   100 mg at 05/28/18 0720  . enoxaparin (LOVENOX) injection 40 mg  40 mg Subcutaneous Q24H Montez Morita, PA-C   40 mg at 05/28/18 0719  . feeding supplement (ENSURE ENLIVE) (ENSURE ENLIVE) liquid 237 mL  237 mL Oral BID BM Myrtie Neither, MD   237 mL at 05/28/18 1043  . HYDROcodone-acetaminophen (NORCO/VICODIN) 5-325 MG per tablet 1-2 tablet  1-2 tablet Oral Q4H PRN Montez Morita, PA-C   2 tablet at 05/28/18 0719  . menthol-cetylpyridinium (CEPACOL) lozenge 3 mg  1 lozenge Oral PRN Montez Morita, PA-C       Or  . phenol (CHLORASEPTIC) mouth spray 1 spray  1 spray Mouth/Throat PRN Montez Morita, PA-C      . metoCLOPramide (REGLAN) tablet 5-10 mg  5-10 mg Oral Q8H PRN Montez Morita, PA-C  Or  . metoCLOPramide (REGLAN) injection 5-10 mg  5-10 mg Intravenous Q8H PRN Montez Morita, PA-C      . metoprolol tartrate (LOPRESSOR) injection 2.5 mg  2.5 mg Intravenous Q4H PRN Montez Morita, PA-C      . morphine 2 MG/ML injection 0.5-1 mg  0.5-1 mg Intravenous Q2H PRN Montez Morita, PA-C      . ondansetron [facility]) tablet 4 mg  4 mg Oral Q6H PRN Montez Morita, PA-C       Or  . ondansetron [facility]) injection 4 mg  4 mg Intravenous Q6H PRN Montez Morita, PA-C         Discharge Medications: Please see discharge summary for a list of discharge medications.  Relevant Imaging  Results:  Relevant Lab Results:   Additional Information SS#[id number] [place], [place]

## 2018-05-29 ENCOUNTER — Encounter (HOSPITAL_COMMUNITY): Payer: Self-pay | Admitting: *Deleted

## 2018-05-29 DIAGNOSIS — K56609 Unspecified intestinal obstruction, unspecified as to partial versus complete obstruction: Secondary | ICD-10-CM | POA: Diagnosis present

## 2018-05-29 HISTORY — DX: Unspecified intestinal obstruction, unspecified as to partial versus complete obstruction: K56.609

## 2018-05-29 LAB — BASIC METABOLIC PANEL
ANION GAP: 6 (ref 5–15)
BUN: 14 mg/dL (ref 8–23)
CO2: 25 mmol/L (ref 22–32)
Calcium: 8.2 mg/dL — ABNORMAL LOW (ref 8.9–10.3)
Chloride: 104 mmol/L (ref 98–111)
Creatinine, Ser: 0.95 mg/dL (ref 0.44–1.00)
GFR calc non Af Amer: 56 mL/min — ABNORMAL LOW (ref >60)
Glucose, Bld: 122 mg/dL — ABNORMAL HIGH (ref 70–99)
Potassium: 4 mmol/L (ref 3.5–5.1)
Sodium: 135 mmol/L (ref 135–145)

## 2018-05-29 LAB — CBC
HCT: 28.2 % — ABNORMAL LOW (ref 36.0–46.0)
Hemoglobin: 9 g/dL — ABNORMAL LOW (ref 12.0–15.0)
MCH: 30.5 pg (ref 26.0–34.0)
MCHC: 31.9 g/dL (ref 30.0–36.0)
MCV: 95.6 fL (ref 80.0–100.0)
Platelets: 168 10*3/uL (ref 150–400)
RBC: 2.95 MIL/uL — ABNORMAL LOW (ref 3.87–5.11)
RDW: 13.2 % (ref 11.5–15.5)
WBC: 8.1 10*3/uL (ref 4.0–10.5)
nRBC: 0 % (ref 0.0–0.2)

## 2018-05-29 MED ORDER — SENNA 8.6 MG PO TABS
2.0000 | ORAL_TABLET | Freq: Every day | ORAL | Status: DC
  Administered 2018-05-29 – 2018-05-30 (×2): 17.2 mg via ORAL
  Filled 2018-05-29 (×2): qty 2

## 2018-05-29 NOTE — Progress Notes (Signed)
SPORTS MEDICINE AND JOINT REPLACEMENT  Georgena Spurling, MD    Laurier Nancy, PA-C [address] [place], [place], [place]  [address]                             [phone]   PROGRESS NOTE  Subjective:  negative for Chest Pain  negative for Shortness of Breath  negative for Nausea/Vomiting   negative for Calf Pain  negative for Bowel Movement   Tolerating Diet: yes         Patient reports pain as 3 on 0-10 scale.    Objective: Vital signs in last 24 hours:    Patient Vitals for the past 24 hrs:  BP Temp Temp src Pulse Resp SpO2  05/29/18 0508 (!) 116/98 99.1 F (37.3 C) Oral 91 16 98 %  05/28/18 1945 132/60 100 F (37.8 C) Oral (!) 109 16 99 %  05/28/18 1421 - - - - - 96 %  05/28/18 1420 (!) 108/45 98.4 F (36.9 C) Oral 83 16 90 %    @flow {1959:LAST@   Intake/Output from previous day:   03/07 0701 - 03/08 0700 In: 720 [P.O.:720] Out: 300 [Urine:300]   Intake/Output this shift:   No intake/output data recorded.   Intake/Output      03/07 0701 - 03/08 0700 03/08 0701 - 03/09 0700   P.O. 720    I.V. (mL/kg)     IV Piggyback     Total Intake(mL/kg) 720 (13.2)    Urine (mL/kg/hr) 300 (0.2)    Blood     Total Output 300    Net +420            LABORATORY DATA: Recent Labs    05/26/18 1945 05/27/18 0213 05/27/18 1341 05/28/18 0404 05/29/18 0541  WBC 8.4 8.1 10.6* 8.6 8.1  HGB 9.7* 10.8* 12.0 9.9* 9.0*  HCT 31.4* 32.9* 37.7 30.1* 28.2*  PLT 147* 169 174 175 168   Recent Labs    05/26/18 1908 05/27/18 0213 05/27/18 1341 05/28/18 0404 05/29/18 0541  NA 134* 135  --  137 135  K 4.5 3.7  --  4.1 4.0  CL 103 109  --  103 104  CO2 25 20*  --  24 25  BUN 18 17  --  16 14  CREATININE 1.07* 0.84 0.95 0.95 0.95  GLUCOSE 135* 134*  --  127* 122*  CALCIUM 9.3 8.1*  --  8.4* 8.2*   Lab Results  Component Value Date   INR 1.0 05/26/2018    Examination:  General appearance: alert, cooperative and no distress Extremities: extremities normal, atraumatic,  no cyanosis or edema  Wound Exam: clean, dry, intact   Drainage:  None: wound tissue dry  Motor Exam: Quadriceps and Hamstrings Intact  Sensory Exam: Superficial Peroneal, Deep Peroneal and Tibial normal   Assessment:    2 Days Post-Op  Procedure(s) (LRB): ARTHROPLASTY BIPOLAR HIP (HEMIARTHROPLASTY) (Right)  ADDITIONAL DIAGNOSIS:  Principal Problem:   Closed right hip fracture, initial encounter (HCC) Active Problems:   Hypertension   Hyponatremia   Normocytic anemia   Anxiety     Plan: Physical Therapy as ordered Weight Bearing as Tolerated (WBAT)  DVT Prophylaxis:  Lovenox  DISCHARGE PLAN: Skilled Nursing Facility/Rehab  PT recommending SNF. Patient is doing well and resting comfortably. Awaiting bed placement  Guy Sandifer 05/29/2018, 9:10 AM

## 2018-05-29 NOTE — Progress Notes (Addendum)
PROGRESS NOTE  Lydia MedinaGeneva F Klein ZOX:[id number] DOB: 03-23-1934 DOA: 05/26/2018 PCP: Elisabeth MostGibson, Heather C, FNP  HPI/Recap of past 24 hours: This is an 85 year old female with past medical history of hypertension anxiety who had an accidental fall at home landing on her left right side she has an underlying dementia.  She sustained a right displaced femoral neck fracture.  Subjective: Patient seen immediate postop at her room.  Complaint she feels tired  May 28, 2018: Subjective: Patient seen and examined at bedside she stated she is feeling a lot better today still has pain but is tolerable  May 29, 2018, subjective: Patient seen and examined, complains of no bowel movement yet despite Colace twice a day but she is passing gas.  She ate well and she feels a lot better today.    Assessment/Plan: Principal Problem:   Closed right hip fracture, initial encounter (HCC) Active Problems:   Hypertension   Hyponatremia   Normocytic anemia   Anxiety   1.  Fall at home  2.  Closed right displaced femoral neck fracture disposed.  Patient underwent physical therapy today and they are recommending SNF.  3.Hypertension controlled continue metoprolol  4.  Mild hyponatremia will replace with normal saline infusion  5.  Normocytic anemia we will continue to monitor H&H  6.  Constipation we will add Senna at bedtime  7.  Patient is awaiting nursing home transfer AKA SNF for rehab they are asking for [facility]  Code Status: Full  Severity of Illness: The appropriate patient status for this patient is INPATIENT. Inpatient status is judged to be reasonable and necessary in order to provide the required intensity of service to ensure the patient's safety. The patient's presenting symptoms, physical exam findings, and initial radiographic and laboratory data in the context of their chronic comorbidities is felt to place them at high risk for further clinical deterioration. Furthermore, it is not  anticipated that the patient will be medically stable for discharge from the hospital within 2 midnights of admission. The following factors support the patient status of inpatient.   " The patient's presenting symptoms include fall with right femoral neck fracture status post right hemiarthroplasty " The worrisome physical exam findings include . " The initial radiographic and laboratory data are worrisome because of displaced fracture of the femoral neck.    * I certify that at the point of admission it is my clinical judgment that the patient will require inpatient hospital care spanning beyond 2 midnights from the point of admission due to high intensity of service, high risk for further deterioration and high frequency of surveillance required.*    Family Communication: Son, Viviann SpareSteven at bedside  Disposition Plan: Home when stable   Consultants:  Orthopedic surgery  Procedures:  right hemiarthroplasty PROCEDURE:  Procedure(s):  ARTHROPLASTY UNIPOLAR HIP (HEMIARTHROPLASTY) (Right) with [place] high demand #2 stem, standard neck, and 43mm head  Antimicrobials:  None  DVT prophylaxis: Subacute heparin   Objective: Vitals:   05/28/18 1945 05/29/18 0508 05/29/18 0710 05/29/18 0715  BP: 132/60 (!) 116/98 (!) 121/59   Pulse: (!) 109 91 89   Resp: 16 16 16    Temp: 100 F (37.8 C) 99.1 F (37.3 C) 98.8 F (37.1 C)   TempSrc: Oral Oral Oral   SpO2: 99% 98% 100% 98%  Weight:      Height:        Intake/Output Summary (Last 24 hours) at 05/29/2018 1257 Last data filed at 05/29/2018 1100 Gross per 24 hour  Intake 720 ml  Output 300 ml  Net 420 ml   Filed Weights   05/27/18 0741  Weight: 54.4 kg   Body mass index is 21.26 kg/m.  Exam:  . General: 85 y.o. year-old female well developed well nourished in no acute distress.  Alert and oriented x3.  Patient is more awake she looks a whole lot better pleasant . Cardiovascular: Regular rate and rhythm with no rubs or  gallops.  No thyromegaly or JVD noted.   Marland Kitchen Respiratory: Clear to auscultation with no wheezes or rales. Good inspiratory effort. . Abdomen: Soft nontender nondistended with normal bowel sounds x4 quadrants. . Musculoskeletal: No lower extremity edema. 2/4 pulses in all 4 extremities. . Skin: No ulcerative lesions noted or rashes, Psychiatry: Mood and affect is appropriate   Data Reviewed: CBC: Recent Labs  Lab 05/26/18 1945 05/27/18 0213 05/27/18 1341 05/28/18 0404 05/29/18 0541  WBC 8.4 8.1 10.6* 8.6 8.1  NEUTROABS 7.3 7.5  --   --   --   HGB 9.7* 10.8* 12.0 9.9* 9.0*  HCT 31.4* 32.9* 37.7 30.1* 28.2*  MCV 96.3 93.2 94.3 94.4 95.6  PLT 147* 169 174 175 168   Basic Metabolic Panel: Recent Labs  Lab 05/26/18 1908 05/27/18 0213 05/27/18 1341 05/28/18 0404 05/29/18 0541  NA 134* 135  --  137 135  K 4.5 3.7  --  4.1 4.0  CL 103 109  --  103 104  CO2 25 20*  --  24 25  GLUCOSE 135* 134*  --  127* 122*  BUN 18 17  --  16 14  CREATININE 1.07* 0.84 0.95 0.95 0.95  CALCIUM 9.3 8.1*  --  8.4* 8.2*   GFR: Estimated Creatinine Clearance: 37.8 mL/min (by C-G formula based on SCr of 0.95 mg/dL). Liver Function Tests: Recent Labs  Lab 05/26/18 1908 05/27/18 0213  AST 29 18  ALT 17 12  ALKPHOS 74 58  BILITOT 0.4 0.9  PROT 6.8 5.4*  ALBUMIN 4.2 3.2*   No results for input(s): LIPASE, AMYLASE in the last 168 hours. No results for input(s): AMMONIA in the last 168 hours. Coagulation Profile: Recent Labs  Lab 05/26/18 1908  INR 1.0   Cardiac Enzymes: No results for input(s): CKTOTAL, CKMB, CKMBINDEX, TROPONINI in the last 168 hours. BNP (last 3 results) No results for input(s): PROBNP in the last 8760 hours. HbA1C: No results for input(s): HGBA1C in the last 72 hours. CBG: No results for input(s): GLUCAP in the last 168 hours. Lipid Profile: No results for input(s): CHOL, HDL, LDLCALC, TRIG, CHOLHDL, LDLDIRECT in the last 72 hours. Thyroid Function Tests: No  results for input(s): TSH, T4TOTAL, FREET4, T3FREE, THYROIDAB in the last 72 hours. Anemia Panel: Recent Labs    05/27/18 0344  VITAMINB12 787  FOLATE 42.8  FERRITIN 61  TIBC 277  IRON 33  RETICCTPCT 1.2   Urine analysis: No results found for: COLORURINE, APPEARANCEUR, LABSPEC, PHURINE, GLUCOSEU, HGBUR, BILIRUBINUR, KETONESUR, PROTEINUR, UROBILINOGEN, NITRITE, LEUKOCYTESUR Sepsis Labs: @LABRCNTIP (procalcitonin:4,lacticidven:4)  ) Recent Results (from the past 240 hour(s))  Surgical pcr screen     Status: None   Collection Time: 05/27/18 12:27 AM  Result Value Ref Range Status   MRSA, PCR NEGATIVE NEGATIVE Final   Staphylococcus aureus NEGATIVE NEGATIVE Final    Comment: (NOTE) The Xpert SA Assay (FDA approved for NASAL specimens in patients 44 years of age and older), is one component of a comprehensive surveillance program. It is not intended to diagnose infection nor to  guide or monitor treatment. Performed at [facility], [address], [place], [place] [address]       Studies: No results found.  Scheduled Meds: . acetaminophen  500 mg Oral Q8H  . docusate sodium  100 mg Oral BID  . enoxaparin (LOVENOX) injection  40 mg Subcutaneous Q24H  . feeding supplement (ENSURE ENLIVE)  237 mL Oral BID BM    Continuous Infusions: . sodium chloride 75 mL/hr at 05/27/18 1856     LOS: 3 days     Myrtie Neither, MD [facility]  To reach me or the doctor on call, go to: [email or web address] Password [facility]  05/29/2018, 12:57 PM

## 2018-05-29 NOTE — Plan of Care (Signed)
  Problem: Clinical Measurements: Goal: Ability to maintain clinical measurements within normal limits will improve Outcome: Progressing   Problem: Clinical Measurements: Goal: Will remain free from infection Outcome: Progressing   Problem: Clinical Measurements: Goal: Diagnostic test results will improve Outcome: Progressing   

## 2018-05-30 ENCOUNTER — Encounter (HOSPITAL_COMMUNITY): Payer: Self-pay | Admitting: Orthopedic Surgery

## 2018-05-30 DIAGNOSIS — I10 Essential (primary) hypertension: Secondary | ICD-10-CM

## 2018-05-30 DIAGNOSIS — E871 Hypo-osmolality and hyponatremia: Secondary | ICD-10-CM

## 2018-05-30 DIAGNOSIS — R42 Dizziness and giddiness: Secondary | ICD-10-CM

## 2018-05-30 DIAGNOSIS — D62 Acute posthemorrhagic anemia: Secondary | ICD-10-CM

## 2018-05-30 DIAGNOSIS — D649 Anemia, unspecified: Secondary | ICD-10-CM

## 2018-05-30 LAB — CBC
HCT: 27.8 % — ABNORMAL LOW (ref 36.0–46.0)
Hemoglobin: 8.8 g/dL — ABNORMAL LOW (ref 12.0–15.0)
MCH: 30 pg (ref 26.0–34.0)
MCHC: 31.7 g/dL (ref 30.0–36.0)
MCV: 94.9 fL (ref 80.0–100.0)
Platelets: 172 10*3/uL (ref 150–400)
RBC: 2.93 MIL/uL — ABNORMAL LOW (ref 3.87–5.11)
RDW: 13.1 % (ref 11.5–15.5)
WBC: 6.6 10*3/uL (ref 4.0–10.5)
nRBC: 0 % (ref 0.0–0.2)

## 2018-05-30 LAB — PREALBUMIN: Prealbumin: 11.2 mg/dL — ABNORMAL LOW (ref 18–38)

## 2018-05-30 LAB — PREPARE RBC (CROSSMATCH)

## 2018-05-30 LAB — ABO/RH: ABO/RH(D): A POS

## 2018-05-30 LAB — TSH: TSH: 1.469 u[IU]/mL (ref 0.350–4.500)

## 2018-05-30 MED ORDER — VITAMIN C 500 MG PO TABS: 500.0000 mg | ORAL_TABLET | Freq: Every day | ORAL | 2 refills | Status: DC

## 2018-05-30 MED ORDER — HYDROCODONE-ACETAMINOPHEN 5-325 MG PO TABS: 1.0000 | ORAL_TABLET | Freq: Four times a day (QID) | ORAL | 0 refills | Status: DC | PRN

## 2018-05-30 MED ORDER — ACETAMINOPHEN 500 MG PO TABS: 500.0000 mg | ORAL_TABLET | Freq: Three times a day (TID) | ORAL | 0 refills | Status: DC | PRN

## 2018-05-30 MED ORDER — SODIUM CHLORIDE 0.9% IV SOLUTION
Freq: Once | INTRAVENOUS | Status: AC
  Administered 2018-05-30: 16:00:00 via INTRAVENOUS

## 2018-05-30 MED ORDER — ENSURE ENLIVE PO LIQD: 237.0000 mL | Freq: Two times a day (BID) | ORAL | 12 refills | Status: DC

## 2018-05-30 MED ORDER — CHOLECALCIFEROL 125 MCG (5000 UT) PO TABS: 1.0000 | ORAL_TABLET | Freq: Every day | ORAL | 3 refills | Status: DC

## 2018-05-30 MED ORDER — ENOXAPARIN SODIUM 40 MG/0.4ML ~~LOC~~ SOLN: 40.0000 mg | SUBCUTANEOUS | 0 refills | Status: DC

## 2018-05-30 MED ORDER — SODIUM CHLORIDE 0.9 % IV BOLUS
500.0000 mL | Freq: Once | INTRAVENOUS | Status: AC
  Administered 2018-05-30: 500 mL via INTRAVENOUS

## 2018-05-30 NOTE — Progress Notes (Signed)
Physical Therapy Treatment Patient Details Name: Lydia Klein MRN: [id number] DOB: May 12, 1933 Today's Date: 05/30/2018    History of Present Illness Lydia Klein is a 85 y.o. female with medical history significant of hypertension, anxiety who had an accidental fall at home landing on her right side, developing pain immediately and inability to get up. Sustained R hip and pelvic fx and underwent R unipolar hip hemiarthroplasty by Dr Carola Frost on 05/27/18.     PT Comments    Patient seen for mobility progression. Pt tolerated short distance gait training this session limited by orthostatic hypotension with c/o dizziness/nausea. RN notified. Continue to progress as tolerated with anticipated d/c to SNF for further skilled PT services.     BP in sitting 110/54 (72), in standing 62/45 (52) after gait, and in reclined position after 3 minutes rest 81/44 (57)    Follow Up Recommendations  SNF;Supervision/Assistance - 24 hour     Equipment Recommendations  Other (comment)(TBD next venue)    Recommendations for Other Services       Precautions / Restrictions Precautions Precautions: Fall;Posterior Hip Precaution Comments: posterior hip precautions reviewed with pt; pt does not recall precautions  Restrictions Weight Bearing Restrictions: Yes RLE Weight Bearing: Weight bearing as tolerated    Mobility  Bed Mobility Overal bed mobility: Needs Assistance Bed Mobility: Supine to Sit;Sit to Supine     Supine to sit: Mod assist Sit to supine: Max assist   General bed mobility comments: pt OOB in chair upon arrival   Transfers Overall transfer level: Needs assistance Equipment used: Rolling walker (2 wheeled) Transfers: Sit to/from Stand Sit to Stand: Min assist         General transfer comment: cues for safe hand placement; pt stood X 2 trials from recliner  Ambulation/Gait Ambulation/Gait assistance: Min assist;+2 safety/equipment Gait Distance (Feet): 8 Feet Assistive  device: Rolling walker (2 wheeled) Gait Pattern/deviations: Step-through pattern;Decreased step length - left;Decreased stance time - right;Shuffle;Decreased weight shift to right Gait velocity: decreased   General Gait Details: cues for sequencing, posture, and increased L step length; pt limited by c/o dizziness and nausea   Stairs             Wheelchair Mobility    Modified Rankin (Stroke Patients Only)       Balance     Sitting balance-Leahy Scale: Fair       Standing balance-Leahy Scale: Poor                              Cognition Arousal/Alertness: Awake/alert Behavior During Therapy: WFL for tasks assessed/performed Overall Cognitive Status: History of cognitive impairments - at baseline                                 General Comments: pleasant and agreeable to all request of therapist. pt with delayed verbalizations to notify staff of changes in symptoms.       Exercises      General Comments General comments (skin integrity, edema, etc.): BP taken in sitting 110/54 (72), in standing 62/45 (52) and then in reclined position 81/44 (57); SCDs placed on pt to aid in elevating BP; pt reports symptoms beginning to clear end of session and RN notified      Pertinent Vitals/Pain Pain Assessment: Faces Faces Pain Scale: Hurts little more Pain Location: R hip with mvmt Pain Descriptors / Indicators:  Operative site guarding;Grimacing Pain Intervention(s): Limited activity within patient's tolerance;Monitored during session;Repositioned    Home Living Family/patient expects to be discharged to:: Skilled nursing facility                    Prior Function Level of Independence: Independent          PT Goals (current goals can now be found in the care plan section) Acute Rehab PT Goals Patient Stated Goal: get better Progress towards PT goals: Progressing toward goals    Frequency    Min 3X/week      PT Plan  Current plan remains appropriate    Co-evaluation              AM-PAC PT "6 Clicks" Mobility   Outcome Measure  Help needed turning from your back to your side while in a flat bed without using bedrails?: A Lot Help needed moving from lying on your back to sitting on the side of a flat bed without using bedrails?: A Lot Help needed moving to and from a bed to a chair (including a wheelchair)?: A Little Help needed standing up from a chair using your arms (e.g., wheelchair or bedside chair)?: A Little Help needed to walk in hospital room?: A Lot Help needed climbing 3-5 steps with a railing? : A Lot 6 Click Score: 14    End of Session Equipment Utilized During Treatment: Gait belt Activity Tolerance: Treatment limited secondary to medical complications (Comment)(orthostatic hypotension) Patient left: in chair;with call bell/phone within reach;with chair alarm set Nurse Communication: Mobility status;Other (comment)(BP) PT Visit Diagnosis: Unsteadiness on feet (R26.81);Muscle weakness (generalized) (M62.81);History of falling (Z91.81);Difficulty in walking, not elsewhere classified (R26.2);Pain Pain - Right/Left: Right Pain - part of body: Hip     Time: 2951-8841 PT Time Calculation (min) (ACUTE ONLY): 35 min  Charges:  $Gait Training: 23-37 mins                     Erline Levine, PTA Acute Rehabilitation Services Pager: [phone] Office: [phone]     Carolynne Edouard 05/30/2018, 4:53 PM

## 2018-05-30 NOTE — Clinical Social Work Note (Signed)
Clinical Social Work Assessment  Patient Details  Name: Lydia Klein MRN: [id number] Date of Birth: 05-22-1933  Date of referral:  05/30/18               Reason for consult:  Discharge Planning                Permission sought to share information with:  Case Manager, Facility Medical sales representative, Family Supports Permission granted to share information::  Yes, Verbal Permission Granted  Name::     Brett Canales  Agency::  SNFs  Relationship::  son  Contact Information:  [phone]  Housing/Transportation Living arrangements for the past 2 months:  Single Family Home Source of Information:  Patient Patient Interpreter Needed:  None Criminal Activity/Legal Involvement Pertinent to Current Situation/Hospitalization:  No - Comment as needed Significant Relationships:  Adult Children Lives with:  Self Do you feel safe going back to the place where you live?  No Need for family participation in patient care:  Yes (Comment)  Care giving concerns:  CSW received referral for possible SNF placement at time of discharge. Spoke with patient regarding possibility of SNF placement . Patient's  family  is currently unable to care for her at their home given patient's current needs and fall risk.  Patient   expressed understanding of PT recommendation and are agreeable to SNF placement at time of discharge. CSW to continue to follow and assist with discharge planning needs.    Social Worker assessment / plan:  Spoke with patient  concerning possibility of rehab at SNF before returning home.    Employment status:  Retired Database administrator PT Recommendations:  Skilled Nursing Facility Information / Referral to community resources:  Skilled Nursing Facility  Patient/Family's Response to care:  Patient  recognizes need for rehab before returning home and are agreeable to a SNF in [employer]. They report preference for  [facility]. CSW explained insurance authorization  process. Patient's family reported that they want patient to get stronger to be able to come back home.    Patient/Family's Understanding of and Emotional Response to Diagnosis, Current Treatment, and Prognosis:  Patient/family is realistic regarding therapy needs and expressed being hopeful for SNF placement. Patient expressed understanding of CSW role and discharge process as well as medical condition. No questions/concerns about plan or treatment.    Emotional Assessment Appearance:  Appears stated age Attitude/Demeanor/Rapport:  Gracious Affect (typically observed):  Accepting Orientation:  Oriented to Self, Oriented to Place, Oriented to  Time, Oriented to Situation Alcohol / Substance use:  Not Applicable Psych involvement (Current and /or in the community):  No (Comment)  Discharge Needs  Concerns to be addressed:  Discharge Planning Concerns Readmission within the last 30 days:  No Current discharge risk:  Dependent with Mobility Barriers to Discharge:  Continued Medical Work up   [employer], LCSW 05/30/2018, 10:42 AM

## 2018-05-30 NOTE — Care Management Important Message (Signed)
Important Message  Patient Details  Name: Lydia Klein MRN: [id number] Date of Birth: December 31, 1933   Medicare Important Message Given:  Yes    Zerick Prevette 05/30/2018, 4:03 PM

## 2018-05-30 NOTE — Discharge Instructions (Addendum)
Orthopaedic Trauma Service Discharge Instructions   General Discharge Instructions  WEIGHT BEARING STATUS: Weight-bear as tolerated right leg  RANGE OF MOTION/ACTIVITY: Posterior hip precautions right hip, activity as tolerated while maintaining range of motion precautions.  [facility] boot is to be worn at all times except for when ambulating and working on range of motion.  Continue to do ankle exercises that were taught to by therapy  Wound Care: daily wound care starting on arrival to facility. See below   Discharge Wound Care Instructions  Do NOT apply any ointments, solutions or lotions to pin sites or surgical wounds.  These prevent needed drainage and even though solutions like hydrogen peroxide kill bacteria, they also damage cells lining the pin sites that help fight infection.  Applying lotions or ointments can keep the wounds moist and can cause them to breakdown and open up as well. This can increase the risk for infection. When in doubt call the office.  Surgical incisions should be dressed daily.  If any drainage is noted, use one layer of adaptic, then gauze, Kerlix, and an ace wrap.  Once the incision is completely dry and without drainage, it may be left open to air out.  Showering may begin 36-48 hours later.  Cleaning gently with soap and water.  Traumatic wounds should be dressed daily as well.    One layer of adaptic, gauze, Kerlix, then ace wrap.  The adaptic can be discontinued once the draining has ceased    If you have a wet to dry dressing: wet the gauze with saline the squeeze as much saline out so the gauze is moist (not soaking wet), place moistened gauze over wound, then place a dry gauze over the moist one, followed by Kerlix wrap, then ace wrap.   DVT/PE prophylaxis: lovenox 40 mg subcutaneous injection daily x 4 weeks   Diet: as you were eating previously.  Can use over the counter stool softeners and bowel preparations, such as Miralax, to help  with bowel movements.  Narcotics can be constipating.  Be sure to drink plenty of fluids  PAIN MEDICATION USE AND EXPECTATIONS  You have likely been given narcotic medications to help control your pain.  After a traumatic event that results in an fracture (broken bone) with or without surgery, it is ok to use narcotic pain medications to help control one's pain.  We understand that everyone responds to pain differently and each individual patient will be evaluated on a regular basis for the continued need for narcotic medications. Ideally, narcotic medication use should last no more than 6-8 weeks (coinciding with fracture healing).   As a patient it is your responsibility as well to monitor narcotic medication use and report the amount and frequency you use these medications when you come to your office visit.   We would also advise that if you are using narcotic medications, you should take a dose prior to therapy to maximize you participation.  IF YOU ARE ON NARCOTIC MEDICATIONS IT IS NOT PERMISSIBLE TO OPERATE A MOTOR VEHICLE (MOTORCYCLE/CAR/TRUCK/MOPED) OR HEAVY MACHINERY DO NOT MIX NARCOTICS WITH OTHER CNS (CENTRAL NERVOUS SYSTEM) DEPRESSANTS SUCH AS ALCOHOL   STOP SMOKING OR USING NICOTINE PRODUCTS!!!!  As discussed nicotine severely impairs your body's ability to heal surgical and traumatic wounds but also impairs bone healing.  Wounds and bone heal by forming microscopic blood vessels (angiogenesis) and nicotine is a vasoconstrictor (essentially, shrinks blood vessels).  Therefore, if vasoconstriction occurs to these microscopic blood vessels they  essentially disappear and are unable to deliver necessary nutrients to the healing tissue.  This is one modifiable factor that you can do to dramatically increase your chances of healing your injury.    (This means no smoking, no nicotine gum, patches, etc)  DO NOT USE NONSTEROIDAL ANTI-INFLAMMATORY DRUGS (NSAID'S)  Using products such as Advil  (ibuprofen), Aleve (naproxen), Motrin (ibuprofen) for additional pain control during fracture healing can delay and/or prevent the healing response.  If you would like to take over the counter (OTC) medication, Tylenol (acetaminophen) is ok.  However, some narcotic medications that are given for pain control contain acetaminophen as well. Therefore, you should not exceed more than 4000 mg of tylenol in a day if you do not have liver disease.  Also note that there are may OTC medicines, such as cold medicines and allergy medicines that my contain tylenol as well.  If you have any questions about medications and/or interactions please ask your doctor/PA or your pharmacist.      ICE AND ELEVATE INJURED/OPERATIVE EXTREMITY  Using ice and elevating the injured extremity above your heart can help with swelling and pain control.  Icing in a pulsatile fashion, such as 20 minutes on and 20 minutes off, can be followed.    Do not place ice directly on skin. Make sure there is a barrier between to skin and the ice pack.    Using frozen items such as frozen peas works well as the conform nicely to the are that needs to be iced.  USE AN ACE WRAP OR TED HOSE FOR SWELLING CONTROL  In addition to icing and elevation, Ace wraps or TED hose are used to help limit and resolve swelling.  It is recommended to use Ace wraps or TED hose until you are informed to stop.    When using Ace Wraps start the wrapping distally (farthest away from the body) and wrap proximally (closer to the body)   Example: If you had surgery on your leg or thing and you do not have a splint on, start the ace wrap at the toes and work your way up to the thigh        If you had surgery on your upper extremity and do not have a splint on, start the ace wrap at your fingers and work your way up to the upper arm  IF YOU ARE IN A SPLINT OR CAST DO NOT REMOVE IT FOR ANY REASON   If your splint gets wet for any reason please contact the office  immediately. You may shower in your splint or cast as long as you keep it dry.  This can be done by wrapping in a cast cover or garbage back (or similar)  Do Not stick any thing down your splint or cast such as pencils, money, or hangers to try and scratch yourself with.  If you feel itchy take benadryl as prescribed on the bottle for itching  IF YOU ARE IN A CAM BOOT (BLACK BOOT)  You may remove boot periodically. Perform daily dressing changes as noted below.  Wash the liner of the boot regularly and wear a sock when wearing the boot. It is recommended that you sleep in the boot until told otherwise  CALL THE OFFICE WITH ANY QUESTIONS OR CONCERNS: [phone]

## 2018-05-30 NOTE — Evaluation (Signed)
Occupational Therapy Evaluation Patient Details Name: Lydia Klein MRN: [id number] DOB: November 30, 1933 Today's Date: 05/30/2018    History of Present Illness Lydia Klein is a 85 y.o. female with medical history significant of hypertension, anxiety who had an accidental fall at home landing on her right side, developing pain immediately and inability to get up. Sustained R hip and pelvic fx and underwent R unipolar hip hemiarthroplasty by Dr Carola Frost on 05/27/18.    Clinical Impression   Patient is s/p R hip hemiarthoplasty surgery and pelvic fx resulting in functional limitations due to the deficits listed below (see OT problem list). Pt currently limited by BP and symptomatic with near syncope. Pt with poor verbalizations of symptoms and required close monitoring during session.  Patient will benefit from skilled OT acutely to increase independence and safety with ADLS to allow discharge SNF.  Orthostatic VS for the past 24 hrs (Last 3 readings):  BP- Lying BP- Sitting BP- Standing at 3 minutes  05/30/18 1500 113/55 116/71 (!) 81/48   Supine after event 125-65 5 minutes supine    Follow Up Recommendations  SNF    Equipment Recommendations  Wheelchair cushion (measurements OT);Wheelchair (measurements OT);3 in 1 bedside commode;Other (comment)(RW -- all at SNF)    Recommendations for Other Services       Precautions / Restrictions Precautions Precautions: Fall;Posterior Hip Precaution Comments: posterior hip precautions Restrictions RLE Weight Bearing: Weight bearing as tolerated      Mobility Bed Mobility Overal bed mobility: Needs Assistance Bed Mobility: Supine to Sit;Sit to Supine     Supine to sit: Mod assist Sit to supine: Max assist   General bed mobility comments: pt requires (A) to progress with pad to EOB. pt orthostatic and near syncope at EOB standigna nd requires MAx (A) to return to supine  Transfers Overall transfer level: Needs assistance Equipment  used: Rolling walker (2 wheeled) Transfers: Sit to/from Stand Sit to Stand: Min assist         General transfer comment: pt static standing 2 minutes with symptoms    Balance                                           ADL either performed or assessed with clinical judgement   ADL Overall ADL's : Needs assistance/impaired Eating/Feeding: Set up   Grooming: Set up   Upper Body Bathing: Minimal assistance;Bed level   Lower Body Bathing: Maximal assistance;Bed level   Upper Body Dressing : Minimal assistance;Bed level   Lower Body Dressing: Maximal assistance;Sit to/from Market researcher Details (indicate cue type and reason): unabel to simulate due to orthostatic only sit<>stand at min (A)            General ADL Comments: pt orthostatic during session      Vision         Perception     Praxis      Pertinent Vitals/Pain Pain Assessment: Faces Faces Pain Scale: Hurts little more Pain Location: R hip with mvmt Pain Descriptors / Indicators: Operative site guarding;Grimacing Pain Intervention(s): Monitored during session;Repositioned;Premedicated before session     Hand Dominance Right   Extremity/Trunk Assessment Upper Extremity Assessment Upper Extremity Assessment: Overall WFL for tasks assessed       Cervical / Trunk Assessment Cervical / Trunk Assessment: Kyphotic   Communication Communication Communication: No difficulties   Cognition  Arousal/Alertness: Awake/alert Behavior During Therapy: WFL for tasks assessed/performed Overall Cognitive Status: History of cognitive impairments - at baseline                                 General Comments: pleasant and agreeable to all request of therapist. pt with delayed verbalizations to notify staff of changes in symptoms.    General Comments  dressing dry and intact    Exercises     Shoulder Instructions      Home Living Family/patient expects to be  discharged to:: Skilled nursing facility                                        Prior Functioning/Environment Level of Independence: Independent                 OT Problem List: Decreased strength;Decreased activity tolerance;Impaired balance (sitting and/or standing);Decreased cognition;Decreased safety awareness;Decreased knowledge of use of DME or AE;Cardiopulmonary status limiting activity;Decreased knowledge of precautions;Pain      OT Treatment/Interventions: Self-care/ADL training;Therapeutic exercise;Neuromuscular education;Energy conservation;DME and/or AE instruction;Manual therapy;Therapeutic activities;Patient/family education;Balance training    OT Goals(Current goals can be found in the care plan section) Acute Rehab OT Goals Patient Stated Goal: get better OT Goal Formulation: With patient Time For Goal Achievement: 06/13/18 Potential to Achieve Goals: Good  OT Frequency: Min 2X/week   Barriers to D/C: Decreased caregiver support          Co-evaluation              AM-PAC OT "6 Clicks" Daily Activity     Outcome Measure Help from another person eating meals?: None Help from another person taking care of personal grooming?: A Little Help from another person toileting, which includes using toliet, bedpan, or urinal?: A Lot Help from another person bathing (including washing, rinsing, drying)?: A Lot Help from another person to put on and taking off regular upper body clothing?: A Little Help from another person to put on and taking off regular lower body clothing?: A Lot 6 Click Score: 16   End of Session Equipment Utilized During Treatment: Gait belt;Rolling walker Nurse Communication: Mobility status;Precautions  Activity Tolerance: Other (comment)(limited by BP) Patient left: in bed;with call bell/phone within reach;with chair alarm set;with nursing/sitter in room;with SCD's reapplied  OT Visit Diagnosis: Unsteadiness on feet  (R26.81);Muscle weakness (generalized) (M62.81)                Time: 1102-1117 OT Time Calculation (min): 20 min Charges:  OT General Charges $OT Visit: 1 Visit OT Evaluation $OT Eval Moderate Complexity: 1 Mod   Mateo Flow, OTR/L  Acute Rehabilitation Services Pager: [phone] Office: [phone] .   Mateo Flow 05/30/2018, 4:31 PM

## 2018-05-30 NOTE — Progress Notes (Signed)
[place], MD called b/c pt BP keeps dropping whenever she stands. MD will order a fluid bolus and 1unit of blood. Will continue to f/u

## 2018-05-30 NOTE — Progress Notes (Signed)
Orthopedic Tech Progress Note Patient Details:  Lydia Klein [birth year] [id number]  Ortho Devices Type of Ortho Device: Prafo boot/shoe Ortho Device/Splint Interventions: Ordered, Application, Adjustment   Post Interventions Patient Tolerated: Well Instructions Provided: Adjustment of device, Care of device   Fiore Detjen J Danna Sewell 05/30/2018, 1:38 PM

## 2018-05-30 NOTE — Progress Notes (Signed)
Orthopedic Trauma Service Progress Note  Patient ID: GAGANDEEP DEKLE MRN: [id number] DOB/AGE: April 25, 1933 85 y.o.  Subjective:  Doing well  No acute issues  Got up with therapy on Saturday       Did not ambulate  Pt on purewick, not sure why!  Review of Systems  Constitutional: Negative for chills and fever.  Respiratory: Negative for shortness of breath and wheezing.   Cardiovascular: Negative for chest pain and palpitations.  Gastrointestinal: Negative for nausea and vomiting.  Neurological: Negative for tingling and sensory change.    Objective:   VITALS:   Vitals:   05/29/18 0508 05/29/18 0710 05/29/18 0715 05/29/18 1431  BP: (!) 116/98 (!) 121/59  (!) 122/49  Pulse: 91 89  95  Resp: 16 16  16   Temp: 99.1 F (37.3 C) 98.8 F (37.1 C)  99.1 F (37.3 C)  TempSrc: Oral Oral  Oral  SpO2: 98% 100% 98% 98%  Weight:      Height:        Estimated body mass index is 21.26 kg/m as calculated from the following:   Height as of this encounter: 5\' 3"  (1.6 m).   Weight as of this encounter: 54.4 kg.   Intake/Output      03/08 0701 - 03/09 0700 03/09 0701 - 03/10 0700   P.O. 360    Total Intake(mL/kg) 360 (6.6)    Urine (mL/kg/hr) 800 (0.6)    Total Output 800    Net -440           LABS  Results for orders placed or performed during the hospital encounter of 05/26/18 (from the past 24 hour(s))  CBC     Status: Abnormal   Collection Time: 05/30/18  3:42 AM  Result Value Ref Range   WBC 6.6 4.0 - 10.5 K/uL   RBC 2.93 (L) 3.87 - 5.11 MIL/uL   Hemoglobin 8.8 (L) 12.0 - 15.0 g/dL   HCT 50.5 (L) 39.7 - 67.3 %   MCV 94.9 80.0 - 100.0 fL   MCH 30.0 26.0 - 34.0 pg   MCHC 31.7 30.0 - 36.0 g/dL   RDW 41.9 37.9 - 02.4 %   Platelets 172 150 - 400 K/uL   nRBC 0.0 0.0 - 0.2 %     PHYSICAL EXAM:   Gen: in bed, sitting up, NAD, very pleasant  Lungs: breathing unlabored  Cardiac: regular    Ext:       Right Lower Extremity   Dressing removed  Incision looks great   No drainage  No signs of infection   Ext warm    + DP pulse  DPN, SPN, TN sensation grossly intact  EHL intact  Ankle extension weak   Toe flexion and ankle flexion intact   Assessment/Plan: 3 Days Post-Op   Principal Problem:   Closed right hip fracture, initial encounter (HCC) Active Problems:   Hypertension   Hyponatremia   Normocytic anemia   Anxiety   SBO (small bowel obstruction) (HCC)   Anti-infectives (From admission, onward)   Start     Dose/Rate Route Frequency Ordered Stop   05/27/18 1330  ceFAZolin (ANCEF) IVPB 2g/100 mL premix     2 g 200 mL/hr over 30 Minutes Intravenous Every 6 hours 05/27/18 1320 05/28/18 0701    .  POD/HD#: 3  85 year old female ground-level fall with right femoral neck fracture   -Fall   -Right subcapital femoral neck fracture s/p R hip hemiarthroplasty              WBAT R leg  Posterior hip precautions R hip   Dressing changed today   Can change as needed at this point   Ok to clean wound with soap and water only   PT/OT   Please also incorporate ankle theraband program and heel cord stretching   Please monitor for foot drop, if present will order AFO    PRAFO boot on when not mobilizing to maintain neutral ankle position    - Pain management:            narcotic use has been very minimal  Primarily controlled on scheduled tylenol   Continue with current regimen    - ABL anemia/Hemodynamics             Currently stable but will monitor  No symptoms    - Medical issues              Per medical service   - DVT/PE prophylaxis:             lovenox x 4 weeks  - ID:              Perioperative antibiotics  - Metabolic Bone Disease:             We will check metabolic bone labs as this fracture is suggestive of osteoporosis             Will need a bone density scan as an outpatient   - Activity:             PT and OT evaluations              Weight-bear as tolerated   - FEN/GI prophylaxis/Foley/Lines:             reg diet   NO PUREWICK NOW THAT PATIENT IS POST-OP, mobilizing and/or transferring to toilet are additional therapy/mobility sessions    - Impediments to fracture healing:             Suspected poor bone quality given the low energy mechanism   - Dispo:            continue with therapies   Await bed at SNF   Ortho issues stable  Follow up with ortho in 2 weeks     Mearl Latin, PA-C [phone] (C) 05/30/2018, 11:04 AM  [facility] [address] [place] [address] [phone] Collier Bullock (F)

## 2018-05-30 NOTE — Progress Notes (Signed)
PROGRESS NOTE  Lydia Klein PVV:[id number] DOB: [birth year] DOA: 05/26/2018 PCP: Elisabeth Most, FNP  HPI/Recap of past 24 hours: This is an 85 year old female with past medical history of hypertension anxiety who had an accidental fall at home landing on her left right side she has an underlying dementia.  She sustained a right displaced femoral neck fracture  Subjective: pt seen in room, no new issues, no complaints. In good spirits. Hb down mid 8's. Patient's BP dropped into 80's this afternoon when standing up.    Assessment/Plan: Principal Problem:   Closed right hip fracture, initial encounter (HCC) Active Problems:   Hypertension   Hyponatremia   Normocytic anemia   Anxiety   SBO (small bowel obstruction) [facility])  Hospital course/ Problems:  1.  Fall at home  2.  Closed R hip fracture: SP ORIF on 05/27/18 by Dr Judie Petit. Handy of orthopedics. Patient getting physical therapy today and they recommend SNF. SNF requests are out and awaiting response.   3.  Hypertension - BP controlled continue metoprolol  4.  Mild hyponatremia - resolved, dc IVF's. Pt eating well.   5.  Normocytic anemia - Hb down to 8's w/ orthostatic BP drops. Will transfuse 1 unit prbc's tonight.  Continue to monitor H&H.   6.  Constipation - added Senna at bedtime  7.  Patient is awaiting nursing home transfer - for rehab, they are asking for [facility]APS   DVT proph: lovenox Code Status: Full Family Communication: no family here today Disposition Plan: dc to SNF when accepted  Vinson Moselle / [facility] [phone] 05/30/2018, 6:03 PM    Consultants:  Orthopedic surgery  Procedures:  right hemiarthroplasty PROCEDURE:  Procedure(s):  ARTHROPLASTY UNIPOLAR HIP (HEMIARTHROPLASTY) (Right) with [employer] [place] high demand #2 stem, standard neck, and 27mm head  Antimicrobials:  None  DVT prophylaxis: Subacute heparin   Objective: Vitals:   05/29/18 0715 05/29/18 1431 05/30/18 1209 05/30/18 1446  BP:  (!)  122/49 (!) 110/56 125/60  Pulse:  95 88 89  Resp:  16 18 17   Temp:  99.1 F (37.3 C) 98.4 F (36.9 C) 98.7 F (37.1 C)  TempSrc:  Oral Oral Oral  SpO2: 98% 98%  100%  Weight:      Height:        Intake/Output Summary (Last 24 hours) at 05/30/2018 1723 Last data filed at 05/30/2018 1600 Gross per 24 hour  Intake 1540.87 ml  Output -  Net 1540.87 ml   Filed Weights   05/27/18 0741  Weight: 54.4 kg   Body mass index is 21.26 kg/m.  Exam:  . General: 85 y.o. year-old female well developed well nourished in no acute distress.  Alert and oriented x3.  Patient is more awake she looks a whole lot better pleasant . Cardiovascular: Regular rate and rhythm with no rubs or gallops.  No thyromegaly or JVD noted.   Marland Kitchen Respiratory: Clear to auscultation with no wheezes or rales. Good inspiratory effort. . Abdomen: Soft nontender nondistended with normal bowel sounds x4 quadrants. . Musculoskeletal: No lower extremity edema. 2/4 pulses in all 4 extremities. . Skin: No ulcerative lesions noted or rashes, Psychiatry: Mood and affect is appropriate   Data Reviewed: CBC: Recent Labs  Lab 05/26/18 1945 05/27/18 0213 05/27/18 1341 05/28/18 0404 05/29/18 0541 05/30/18 0342  WBC 8.4 8.1 10.6* 8.6 8.1 6.6  NEUTROABS 7.3 7.5  --   --   --   --   HGB 9.7* 10.8* 12.0 9.9* 9.0* 8.8*  HCT 31.4* 32.9* 37.7 30.1* 28.2* 27.8*  MCV 96.3 93.2 94.3 94.4 95.6 94.9  PLT 147* 169 174 175 168 172   Basic Metabolic Panel: Recent Labs  Lab 05/26/18 1908 05/27/18 0213 05/27/18 1341 05/28/18 0404 05/29/18 0541  NA 134* 135  --  137 135  K 4.5 3.7  --  4.1 4.0  CL 103 109  --  103 104  CO2 25 20*  --  24 25  GLUCOSE 135* 134*  --  127* 122*  BUN 18 17  --  16 14  CREATININE 1.07* 0.84 0.95 0.95 0.95  CALCIUM 9.3 8.1*  --  8.4* 8.2*   GFR: Estimated Creatinine Clearance: 37.8 mL/min (by C-G formula based on SCr of 0.95 mg/dL). Liver Function Tests: Recent Labs  Lab 05/26/18 1908  05/27/18 0213  AST 29 18  ALT 17 12  ALKPHOS 74 58  BILITOT 0.4 0.9  PROT 6.8 5.4*  ALBUMIN 4.2 3.2*   No results for input(s): LIPASE, AMYLASE in the last 168 hours. No results for input(s): AMMONIA in the last 168 hours. Coagulation Profile: Recent Labs  Lab 05/26/18 1908  INR 1.0   Cardiac Enzymes: No results for input(s): CKTOTAL, CKMB, CKMBINDEX, TROPONINI in the last 168 hours. BNP (last 3 results) No results for input(s): PROBNP in the last 8760 hours. HbA1C: No results for input(s): HGBA1C in the last 72 hours. CBG: No results for input(s): GLUCAP in the last 168 hours. Lipid Profile: No results for input(s): CHOL, HDL, LDLCALC, TRIG, CHOLHDL, LDLDIRECT in the last 72 hours. Thyroid Function Tests: Recent Labs    05/30/18 1143  TSH 1.469   Anemia Panel: No results for input(s): VITAMINB12, FOLATE, FERRITIN, TIBC, IRON, RETICCTPCT in the last 72 hours. Urine analysis: No results found for: COLORURINE, APPEARANCEUR, LABSPEC, PHURINE, GLUCOSEU, HGBUR, BILIRUBINUR, KETONESUR, PROTEINUR, UROBILINOGEN, NITRITE, LEUKOCYTESUR Sepsis Labs: @LABRCNTIP (procalcitonin:4,lacticidven:4)  ) Recent Results (from the past 240 hour(s))  Surgical pcr screen     Status: None   Collection Time: 05/27/18 12:27 AM  Result Value Ref Range Status   MRSA, PCR NEGATIVE NEGATIVE Final   Staphylococcus aureus NEGATIVE NEGATIVE Final    Comment: (NOTE) The Xpert SA Assay (FDA approved for NASAL specimens in patients 1 years of age and older), is one component of a comprehensive surveillance program. It is not intended to diagnose infection nor to guide or monitor treatment. Performed at [facility], [address] [place], [place] [address]       Studies: No results found.  Scheduled Meds: . acetaminophen  500 mg Oral Q8H  . docusate sodium  100 mg Oral BID  . enoxaparin (LOVENOX) injection  40 mg Subcutaneous Q24H  . feeding supplement (ENSURE ENLIVE)  237 mL Oral  BID BM  . senna  2 tablet Oral QHS    Continuous Infusions: . sodium chloride 75 mL/hr at 05/27/18 1856     LOS: 4 days   [facility] To reach me or the doctor on call, go to: [email or web address] Password [email or web address]  05/30/2018, 5:23 PM

## 2018-05-30 NOTE — Progress Notes (Signed)
Charge nurse gave update on pt condition. Pt in bed alert, verbally responsive, with c/o weakness. Started bolus of IVF per orders. Awaiting type and screen for blood transfusion. Notified son Randa Spike of pt condition.

## 2018-05-31 DIAGNOSIS — W19XXXA Unspecified fall, initial encounter: Secondary | ICD-10-CM

## 2018-05-31 DIAGNOSIS — F419 Anxiety disorder, unspecified: Secondary | ICD-10-CM

## 2018-05-31 DIAGNOSIS — D62 Acute posthemorrhagic anemia: Secondary | ICD-10-CM

## 2018-05-31 LAB — TYPE AND SCREEN
ABO/RH(D): A POS
ANTIBODY SCREEN: NEGATIVE
Unit division: 0

## 2018-05-31 LAB — BPAM RBC
Blood Product Expiration Date: 202004012359
ISSUE DATE / TIME: 202003091808
Unit Type and Rh: 6200

## 2018-05-31 LAB — CBC
HCT: 30.2 % — ABNORMAL LOW (ref 36.0–46.0)
Hemoglobin: 9.8 g/dL — ABNORMAL LOW (ref 12.0–15.0)
MCH: 29.8 pg (ref 26.0–34.0)
MCHC: 32.5 g/dL (ref 30.0–36.0)
MCV: 91.8 fL (ref 80.0–100.0)
Platelets: 197 10*3/uL (ref 150–400)
RBC: 3.29 MIL/uL — ABNORMAL LOW (ref 3.87–5.11)
RDW: 14.4 % (ref 11.5–15.5)
WBC: 5.2 10*3/uL (ref 4.0–10.5)
nRBC: 0 % (ref 0.0–0.2)

## 2018-05-31 LAB — BASIC METABOLIC PANEL
Anion gap: 8 (ref 5–15)
BUN: 14 mg/dL (ref 8–23)
CHLORIDE: 104 mmol/L (ref 98–111)
CO2: 25 mmol/L (ref 22–32)
Calcium: 8.4 mg/dL — ABNORMAL LOW (ref 8.9–10.3)
Creatinine, Ser: 0.82 mg/dL (ref 0.44–1.00)
GFR calc Af Amer: >60 mL/min (ref >60)
GFR calc non Af Amer: >60 mL/min (ref >60)
GLUCOSE: 114 mg/dL — AB (ref 70–99)
Potassium: 3.6 mmol/L (ref 3.5–5.1)
Sodium: 137 mmol/L (ref 135–145)

## 2018-05-31 LAB — CALCITRIOL (1,25 DI-OH VIT D): Vit D, 1,25-Dihydroxy: 43.5 pg/mL (ref 19.9–79.3)

## 2018-05-31 LAB — PTH, INTACT AND CALCIUM
Calcium, Total (PTH): 8.1 mg/dL — ABNORMAL LOW (ref 8.7–10.3)
PTH: 23 pg/mL (ref 15–65)

## 2018-05-31 LAB — VITAMIN D 25 HYDROXY (VIT D DEFICIENCY, FRACTURES): Vit D, 25-Hydroxy: 34.3 ng/mL (ref 30.0–100.0)

## 2018-05-31 MED ORDER — CHOLECALCIFEROL 125 MCG (5000 UT) PO TABS: 1.0000 | ORAL_TABLET | Freq: Every day | ORAL | 3 refills | Status: AC

## 2018-05-31 MED ORDER — VITAMIN C 500 MG PO TABS: 500.0000 mg | ORAL_TABLET | Freq: Every day | ORAL | 2 refills | Status: AC

## 2018-05-31 MED ORDER — POLYETHYLENE GLYCOL 3350 17 G PO PACK
17.0000 g | PACK | Freq: Every day | ORAL | Status: DC | PRN
  Administered 2018-05-31: 17 g via ORAL
  Filled 2018-05-31: qty 1

## 2018-05-31 MED ORDER — BISACODYL 10 MG RE SUPP
10.0000 mg | Freq: Once | RECTAL | Status: AC
  Administered 2018-05-31: 10 mg via RECTAL
  Filled 2018-05-31: qty 1

## 2018-05-31 MED ORDER — DOCUSATE SODIUM 100 MG PO CAPS: 100.0000 mg | ORAL_CAPSULE | Freq: Two times a day (BID) | ORAL | 0 refills | Status: AC

## 2018-05-31 MED ORDER — ACETAMINOPHEN 500 MG PO TABS: 500.0000 mg | ORAL_TABLET | Freq: Three times a day (TID) | ORAL | 0 refills | Status: AC | PRN

## 2018-05-31 MED ORDER — HYDROCODONE-ACETAMINOPHEN 5-325 MG PO TABS: 1.0000 | ORAL_TABLET | Freq: Four times a day (QID) | ORAL | 0 refills | Status: AC | PRN

## 2018-05-31 MED ORDER — POLYETHYLENE GLYCOL 3350 17 G PO PACK: 17.0000 g | PACK | Freq: Every day | ORAL | 0 refills | Status: AC | PRN

## 2018-05-31 MED ORDER — ENOXAPARIN SODIUM 40 MG/0.4ML ~~LOC~~ SOLN: 40.0000 mg | SUBCUTANEOUS | 0 refills | Status: DC

## 2018-05-31 MED ORDER — ENSURE ENLIVE PO LIQD: 237.0000 mL | Freq: Two times a day (BID) | ORAL | 12 refills | Status: AC

## 2018-05-31 MED ORDER — SENNA 8.6 MG PO TABS: 2.0000 | ORAL_TABLET | Freq: Every day | ORAL | 0 refills | Status: AC

## 2018-05-31 NOTE — Progress Notes (Signed)
PROGRESS NOTE  Lydia Klein NKN:[id number] DOB: 16-Jul-1933 DOA: 05/26/2018 PCP: Lydia Most, FNP  HPI/Recap of past 24 hours: This is an 85 year old female with past medical history of hypertension anxiety who had an accidental fall at home landing on her left right side she has an underlying dementia.  She sustained a right displaced femoral neck fracture  Subjective: pt seen in room, no new issues, no complaints. In good spirits. Hb down mid 8's. Patient's BP dropped into 80's this afternoon when standing up.    Assessment/Plan: Principal Problem:   Closed right hip fracture, initial encounter (HCC) Active Problems:   Hypertension   Hyponatremia   Normocytic anemia   Anxiety   SBO (small bowel obstruction) [facility])  Hospital course/ Problems:  1.  Fall at home  2.  Closed R hip fracture: SP ORIF on 05/27/18 by Dr Judie Petit. [facility]. Patient getting physical therapy today and they recommend SNF. SNF requests are out and awaiting response.  - SW says they are expecting to have a bed offer possibly later today but more likely tomorrow (Wed)  3.  Hypertension - BP controlled continue metoprolol  4.  Mild hyponatremia - resolved, dc'd IVF's. Pt eating well.   5.  Anemia postop / ABL  - Hb down to 8's w/ orthostatic BP drops on 3/9  > we gave 1u prbc's on 3/9. - Hb up 9.8 today, no evidence bleeding - Continue to monitor H&H.   6.  Constipation - added Senna at bedtime    DVT proph: lovenox Code Status: Full Family Communication: no family here today Disposition Plan: dc to SNF when accepted  [employer] [facility] [phone] 05/31/2018, 1:42 PM    Consultants:  Orthopedic surgery  Procedures:  right hemiarthroplasty PROCEDURE:  Procedure(s):  ARTHROPLASTY UNIPOLAR HIP (HEMIARTHROPLASTY) (Right) with De[place]uy [place] high demand #2 stem, standard neck, and 53mm head  Antimicrobials:  None  DVT prophylaxis: Subacute heparin   Objective: Vitals:   05/30/18 1804 05/30/18 1830 05/30/18 2120 05/31/18 0427  BP: (!) 124/46 (!) 126/46 (!) 122/50 133/62  Pulse: 98 97 92 90  Resp: 18 18 15 15   Temp: 99.2 F (37.3 C) 99.2 F (37.3 C) 99.2 F (37.3 C) 98.2 F (36.8 C)  TempSrc: Oral Oral Oral Oral  SpO2: 99% 97% 97% 98%  Weight:      Height:        Intake/Output Summary (Last 24 hours) at 05/31/2018 1342 Last data filed at 05/31/2018 0900 Gross per 24 hour  Intake 2632.87 ml  Output -  Net 2632.87 ml   Filed Weights   05/27/18 0741  Weight: 54.4 kg   Body mass index is 21.26 kg/m.  Exam:  . General: 85 y.o. year-old female well developed well nourished in no acute distress.  Alert and oriented x3.  Patient is more awake she looks a whole lot better pleasant . Cardiovascular: Regular rate and rhythm with no rubs or gallops.  No thyromegaly or JVD noted.   Marland Kitchen Respiratory: Clear to auscultation with no wheezes or rales. Good inspiratory effort. . Abdomen: Soft nontender nondistended with normal bowel sounds x4 quadrants. . Musculoskeletal: No lower extremity edema. 2/4 pulses in all 4 extremities. . Skin: No ulcerative lesions noted or rashes, Psychiatry: Mood and affect is appropriate   Data Reviewed: CBC: Recent Labs  Lab 05/26/18 1945 05/27/18 0213 05/27/18 1341 05/28/18 0404 05/29/18 0541 05/30/18 0342 05/31/18 0248  WBC 8.4 8.1 10.6* 8.6 8.1 6.6 5.2  NEUTROABS 7.3 7.5  --   --   --   --   --   HGB 9.7* 10.8* 12.0 9.9* 9.0* 8.8* 9.8*  HCT 31.4* 32.9* 37.7 30.1* 28.2* 27.8* 30.2*  MCV 96.3 93.2 94.3 94.4 95.6 94.9 91.8  PLT 147* 169 174 175 168 172 197   Basic Metabolic Panel: Recent Labs  Lab 05/26/18 1908 05/27/18 0213 05/27/18 1341 05/28/18 0404 05/29/18 0541 05/30/18 1143 05/31/18 0248  NA 134* 135  --  137 135  --  137  K 4.5 3.7  --  4.1 4.0  --  3.6  CL 103 109  --  103 104  --  104  CO2 25 20*  --  24 25  --  25  GLUCOSE 135* 134*  --  127* 122*  --  114*  BUN 18 17  --  16 14  --  14    CREATININE 1.07* 0.84 0.95 0.95 0.95  --  0.82  CALCIUM 9.3 8.1*  --  8.4* 8.2* 8.1* 8.4*   GFR: Estimated Creatinine Clearance: 43.8 mL/min (by C-G formula based on SCr of 0.82 mg/dL). Liver Function Tests: Recent Labs  Lab 05/26/18 1908 05/27/18 0213  AST 29 18  ALT 17 12  ALKPHOS 74 58  BILITOT 0.4 0.9  PROT 6.8 5.4*  ALBUMIN 4.2 3.2*   No results for input(s): LIPASE, AMYLASE in the last 168 hours. No results for input(s): AMMONIA in the last 168 hours. Coagulation Profile: Recent Labs  Lab 05/26/18 1908  INR 1.0   Cardiac Enzymes: No results for input(s): CKTOTAL, CKMB, CKMBINDEX, TROPONINI in the last 168 hours. BNP (last 3 results) No results for input(s): PROBNP in the last 8760 hours. HbA1C: No results for input(s): HGBA1C in the last 72 hours. CBG: No results for input(s): GLUCAP in the last 168 hours. Lipid Profile: No results for input(s): CHOL, HDL, LDLCALC, TRIG, CHOLHDL, LDLDIRECT in the last 72 hours. Thyroid Function Tests: Recent Labs    05/30/18 1143  TSH 1.469   Anemia Panel: No results for input(s): VITAMINB12, FOLATE, FERRITIN, TIBC, IRON, RETICCTPCT in the last 72 hours. Urine analysis: No results found for: COLORURINE, APPEARANCEUR, LABSPEC, PHURINE, GLUCOSEU, HGBUR, BILIRUBINUR, KETONESUR, PROTEINUR, UROBILINOGEN, NITRITE, LEUKOCYTESUR Sepsis Labs: @LABRCNTIP (procalcitonin:4,lacticidven:4)  ) Recent Results (from the past 240 hour(s))  Surgical pcr screen     Status: None   Collection Time: 05/27/18 12:27 AM  Result Value Ref Range Status   MRSA, PCR NEGATIVE NEGATIVE Final   Staphylococcus aureus NEGATIVE NEGATIVE Final    Comment: (NOTE) The Xpert SA Assay (FDA approved for NASAL specimens in patients 39 years of age and older), is one component of a comprehensive surveillance program. It is not intended to diagnose infection nor to guide or monitor treatment. Performed at [facility], [address], [place],  [place] [address]       Studies: No results found.  Scheduled Meds: . acetaminophen  500 mg Oral Q8H  . docusate sodium  100 mg Oral BID  . enoxaparin (LOVENOX) injection  40 mg Subcutaneous Q24H  . feeding supplement (ENSURE ENLIVE)  237 mL Oral BID BM  . senna  2 tablet Oral QHS    Continuous Infusions:    LOS: 5 days   [facility] To reach me or the doctor on call, go to: [email or web address] Password [email or web address]  05/31/2018, 1:42 PM

## 2018-05-31 NOTE — Clinical Social Work Placement (Signed)
   CLINICAL SOCIAL WORK PLACEMENT  NOTE  Date:  05/31/2018  Patient Details  Name: Lydia Klein MRN: [id number] Date of Birth: 1933-12-21  Clinical Social Work is seeking post-discharge placement for this patient at the Skilled  Nursing Facility level of care (*CSW will initial, date and re-position this form in  chart as items are completed):  Yes   Patient/family provided with [facility] Clinical Social Work Department's list of facilities offering this level of care within the geographic area requested by the patient (or if unable, by the patient's family).  Yes   Patient/family informed of their freedom to choose among providers that offer the needed level of care, that participate in Medicare, Medicaid or managed care program needed by the patient, have an available bed and are willing to accept the patient.      Patient/family informed of [facility]'s ownership interest in [facility] and [facility], as well as of the fact that they are under no obligation to receive care at these facilities.  PASRR submitted to EDS on       PASRR number received on 05/28/18     Existing PASRR number confirmed on       FL2 transmitted to all facilities in geographic area requested by pt/family on 05/28/18     FL2 transmitted to all facilities within larger geographic area on       Patient informed that his/her managed care company has contracts with or will negotiate with certain facilities, including the following:        Yes   Patient/family informed of bed offers received.  Patient chooses bed at [facility] [place]     Physician recommends and patient chooses bed at      Patient to be transferred to [facility] [place] on 05/31/18.  Patient to be transferred to facility by [facility]     Patient family notified on 05/31/18 of transfer.  Name of family member notified:  Debra     PHYSICIAN       Additional Comment:     _______________________________________________ Gildardo Griffes, LCSW 05/31/2018, 3:43 PM

## 2018-05-31 NOTE — Progress Notes (Signed)
Patient discharging to [facility]. Report called in to [facility] LPN. Awaiting transportation.

## 2018-05-31 NOTE — Discharge Summary (Signed)
Physician Discharge Summary  Patient ID: Lydia Klein MRN: [id number] DOB/AGE: 10/21/1933 85 y.o.  Admit date: 05/26/2018 Discharge date: 05/31/2018  Admission Diagnoses:   Closed right hip fracture, initial encounter   Hypertension   Hyponatremia   Anxiety  Discharge Diagnoses:  Principal Problem:   Closed right hip fracture, initial encounter   SP ORIF R hip fracture   Hypertension   Hyponatremia, resolved   Anemia acute blood loss, mild   Anxiety   Discharged Condition: good  Presentation Summary: Lydia Klein is a 85 y.o. female with medical history significant of hypertension, anxiety who had an accidental fall at home landing on her right side, developing pain immediately and inability to get up.  She had her son called EMS and she was subsequently transported to the emergency department.  She denies fever, chills, sore throat, dyspnea, wheezing, hemoptysis, chest pain, palpitations, diaphoresis, dizziness, PND, orthopnea or pitting edema of the lower extremities.  No abdominal pain, nausea or emesis, constipation or diarrhea, hematochezia or melena.  No dysuria, frequency hematuria.  Denies polyuria, polydipsia, polyphagia or blurred vision. ED Course: Initial vital signs temperature 98.7 F, pulse 97, respirations 16, 103/74 mmHg and O2 sat 98% on room air.  Patient received fentanyl 50 mcg IVP in the emergency department.  White count is 8.4, hemoglobin 9.7 g/dL and platelets 891.  PT was 13.4 seconds and INR 1.0.  CMP shows a sodium 134 mmol/L.  Glucose 135 and creatinine 1.07 mg/dL.  All other values are within normal limits. Imaging: Pelvic and femur x-ray showed displaced fracture within the subcapital portion of the right femoral neck.  Chest radiograph no active disease.  CT head without contrast no acute intracranial pathology.  Hospital Course:  1.  Closed R hip fracture: SP ORIF on 05/27/18 by Dr Judie Petit. [facility] of orthopedics. PT recommended SNF and pt was accepted  today. Plan DC to SNF today.   2.  Fall at home  3.  Hypertension - BP controlled continue metoprolol  4.  Mild hyponatremia - resolved, dc'd IVF's. Pt eating well.   5.  Anemia postop / ABL  - Hb down to 8's w/ orthostatic BP drops on 3/9  > we gave 1u prbc's on 3/9. - Hb up 9.8 at dc, no evidence bleeding  6.  Constipation - added Senna at bedtime   Consultants:  Orthopedic surgery  Procedures:  right hemiarthroplasty PROCEDURE: Procedure(s):  ARTHROPLASTY UNIPOLAR HIP (HEMIARTHROPLASTY) (Right) with De[place] high demand #2 stem, standard neck, and 35mm head  Antimicrobials:  None   Discharge Exam: Blood pressure (!) 121/53, pulse 88, temperature 98.2 F (36.8 C), temperature source Oral, resp. rate 16, height 5\' 3"  (1.6 m), weight 54.4 kg, SpO2 100 %.  General: 85 y.o. year-old female well developed well nourished in no acute distress.  Alert and oriented x3.  Patient is more awake she looks a whole lot better pleasant  Cardiovascular: Regular rate and rhythm with no rubs or gallops.  No thyromegaly or JVD noted.    Respiratory: Clear to auscultation with no wheezes or rales. Good inspiratory effort.  Abdomen: Soft nontender nondistended with normal bowel sounds x4 quadrants.  Musculoskeletal: No lower extremity edema. 2/4 pulses in all 4 extremities.  Skin: No ulcerative lesions noted or rashes, Psychiatry: Mood and affect is appropriate  Disposition: Discharge disposition: 03-Skilled Nursing Facility        Allergies as of 05/31/2018      Reactions   Codeine Nausea And Vomiting  Ibuprofen Swelling      Medication List    STOP taking these medications   aspirin EC 81 MG tablet   diazepam 5 MG tablet Commonly known as:  VALIUM   spironolactone 25 MG tablet Commonly known as:  ALDACTONE     TAKE these medications   acetaminophen 500 MG tablet Commonly known as:  TYLENOL Take 1 tablet (500 mg total) by mouth every 8 (eight) hours  as needed for mild pain or moderate pain.   Cholecalciferol 125 MCG (5000 UT) Tabs Take 1 tablet (5,000 Units total) by mouth daily.   docusate sodium 100 MG capsule Commonly known as:  COLACE Take 1 capsule (100 mg total) by mouth 2 (two) times daily.   enoxaparin 40 MG/0.4ML injection Commonly known as:  LOVENOX Inject 0.4 mLs (40 mg total) into the skin daily for 21 days. For 3 weeks total.  When patient becomes ambulatory may dc the Lovenox injections.   feeding supplement (ENSURE ENLIVE) Liqd Take 237 mLs by mouth 2 (two) times daily between meals.   HYDROcodone-acetaminophen 5-325 MG tablet Commonly known as:  NORCO/VICODIN Take 1-2 tablets by mouth every 6 (six) hours as needed for severe pain.   polyethylene glycol packet Commonly known as:  MIRALAX / GLYCOLAX Take 17 g by mouth daily as needed for mild constipation or moderate constipation.   senna 8.6 MG Tabs tablet Commonly known as:  SENOKOT Take 2 tablets (17.2 mg total) by mouth at bedtime.   vitamin C 500 MG tablet Commonly known as:  ASCORBIC ACID Take 1 tablet (500 mg total) by mouth daily.      Follow-up Information    Myrene Galas, MD. Schedule an appointment as soon as possible for a visit in 2 week(s).   Specialty:  Orthopedic Surgery Contact information: [address] [place] [address] [phone]           Signed: Maree Krabbe 05/31/2018, 3:40 PM

## 2018-05-31 NOTE — Plan of Care (Signed)
  Problem: Education: Goal: Knowledge of General Education information will improve Description: Including pain rating scale, medication(s)/side effects and non-pharmacologic comfort measures Outcome: Progressing   Problem: Activity: Goal: Risk for activity intolerance will decrease Outcome: Progressing   Problem: Elimination: Goal: Will not experience complications related to bowel motility Outcome: Progressing   Problem: Safety: Goal: Ability to remain free from injury will improve Outcome: Progressing   Problem: Skin Integrity: Goal: Risk for impaired skin integrity will decrease Outcome: Progressing   

## 2018-05-31 NOTE — Progress Notes (Signed)
Patient will DC IW:[facility] Anticipated DC date:05/31/2018 Family notified:Debra Transport YQ:MGNO  Per MD patient ready for DC to [facility]. RN, patient, patient's family, and facility notified of DC. Discharge Summary sent to facility. RN given number for report [phone]. DC packet on chart. Ambulance transport requested for patient.  CSW signing off.  [place], [place] [phone]

## 2020-02-22 IMAGING — DX DG FEMUR 2+V*R*
4 series · 4 of 4 positions shown · non-contrast
Comparison: None.

CLINICAL DATA: Fall, pain.

EXAM:
RIGHT FEMUR 2 VIEWS

[femur ap (1 of 2)]
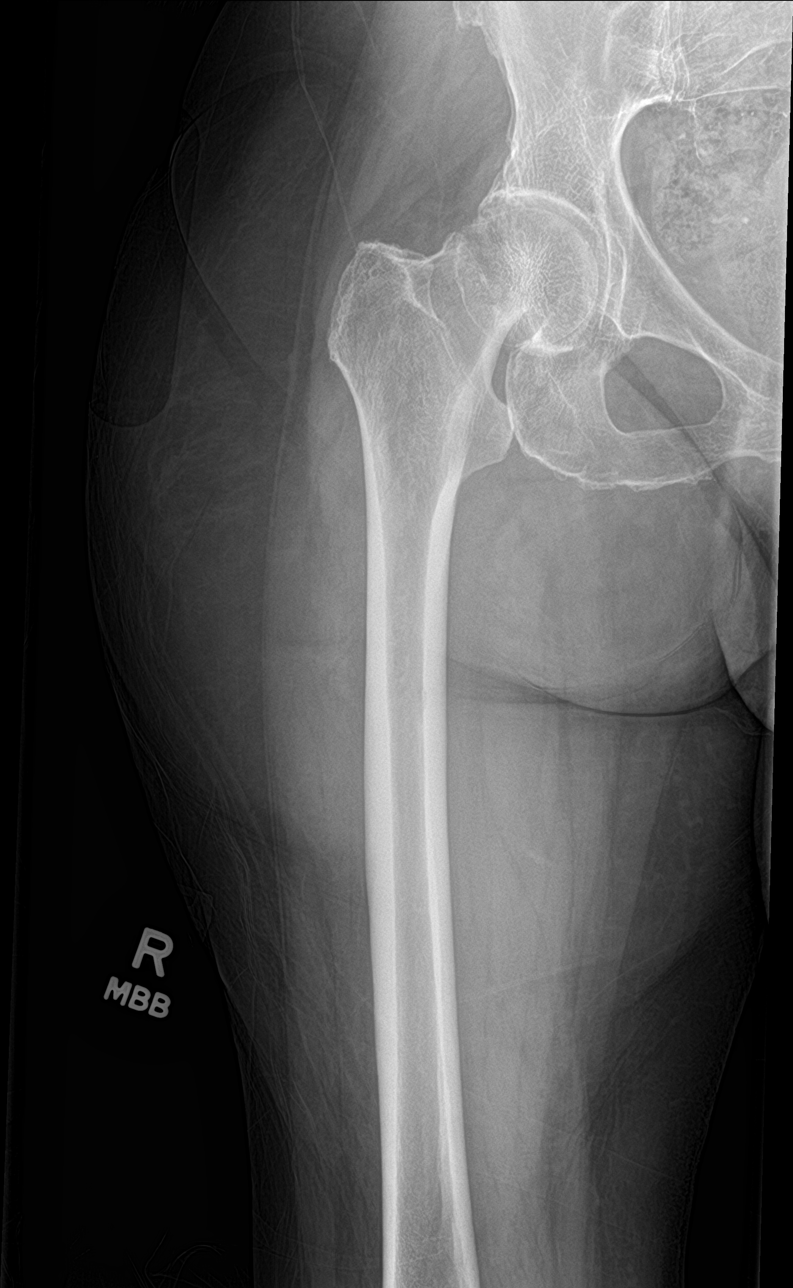

[femur ap (2 of 2)]
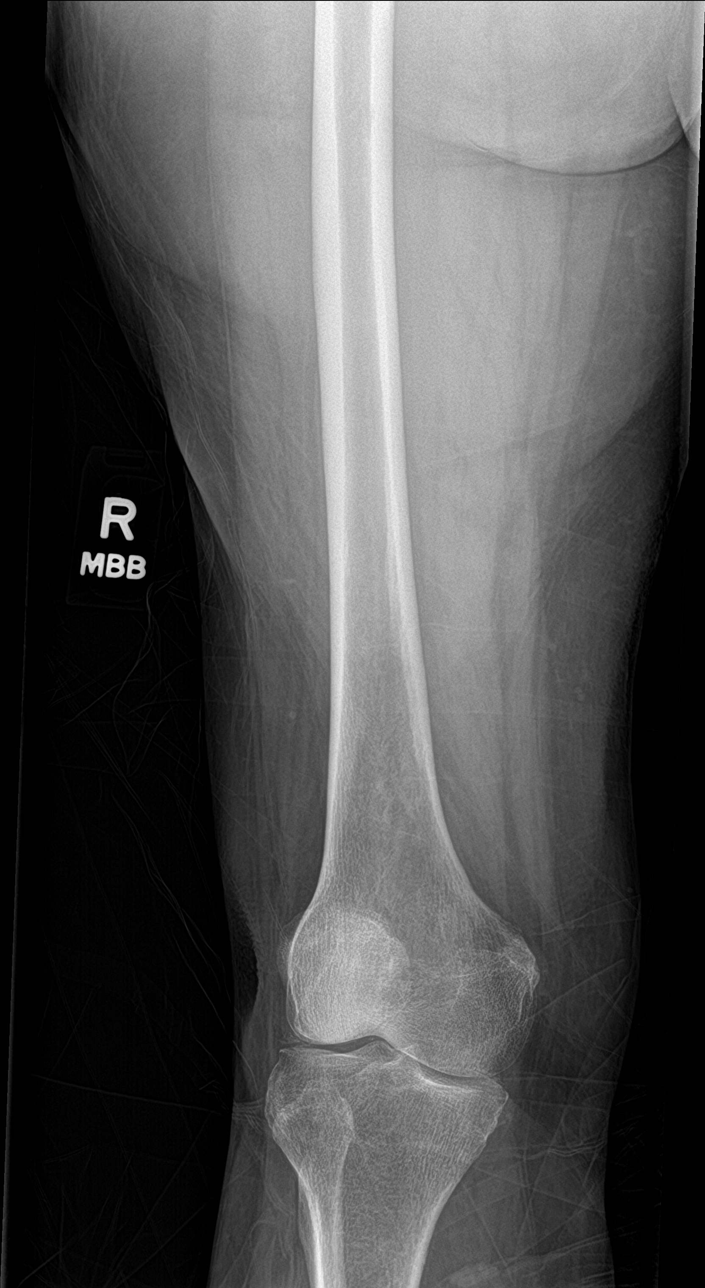

[femur lat (1 of 2)]
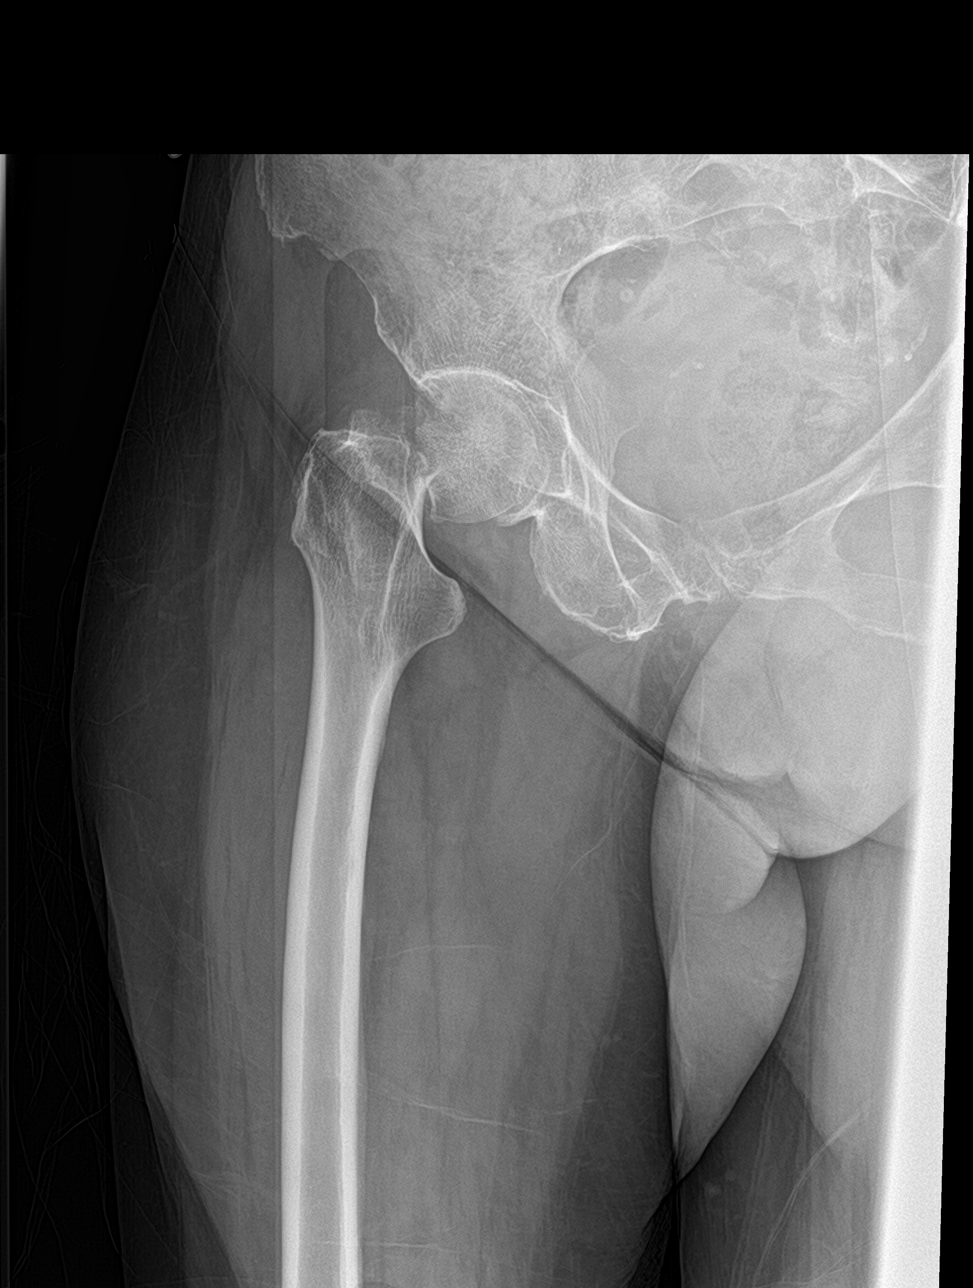

[femur lat (2 of 2)]
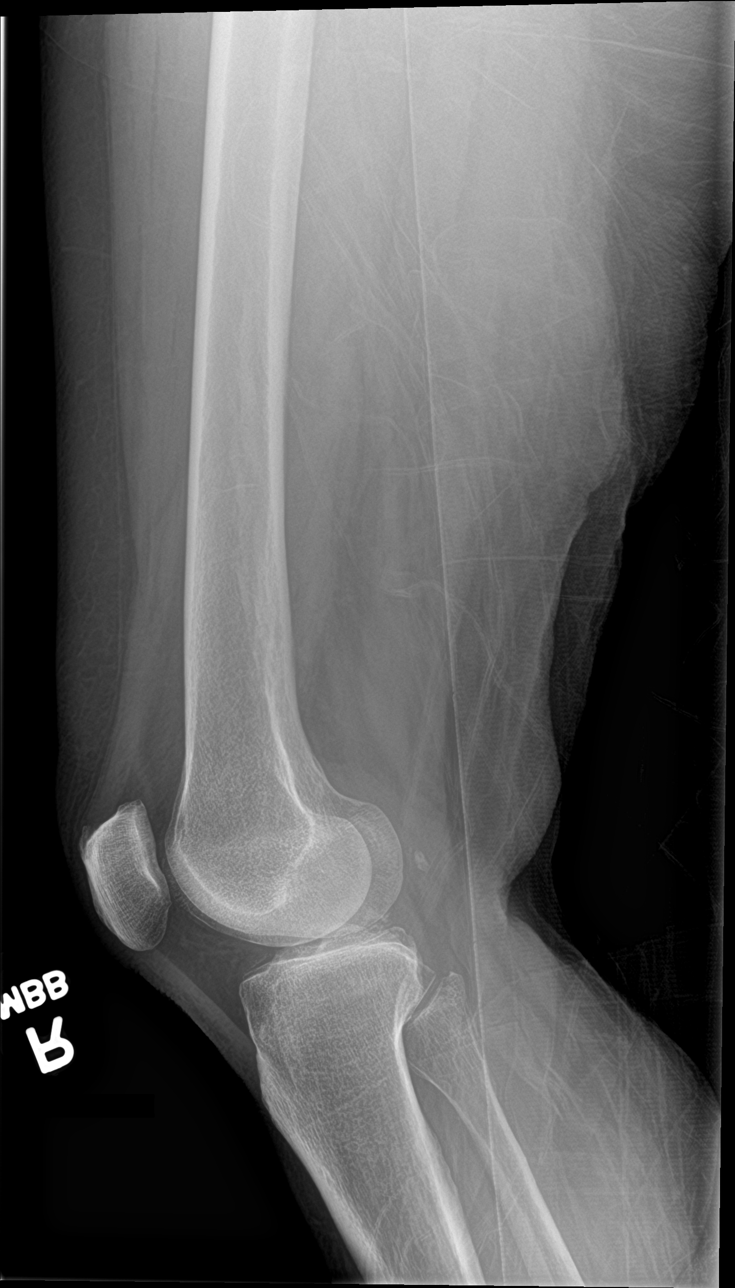

[4 of 4 positions shown; findings below may reference images not displayed]

FINDINGS: Displaced fracture within the subcapital portion of the RIGHT
femoral neck. Femoral head remains normally positioned relative to
the acetabulum. Nearly 90 degree angulation deformity at the
fracture site.

Remainder of the RIGHT femur appears intact and normally aligned.
IMPRESSION: Displaced fracture within the subcapital portion of the RIGHT
femoral neck.

## 2020-02-22 IMAGING — CT CT HEAD W/O CM
4 series · 16 of 47 positions shown, 18 images · non-contrast
Comparison: None.

CLINICAL DATA: 84 y/o  F; mechanical fall to the right-sided body.

EXAM:
CT HEAD WITHOUT CONTRAST
TECHNIQUE: Contiguous axial images were obtained from the base of the skull
through the vertex without intravenous contrast.

[Series 3: head without · axial · non-contrast · 0.45mm/px · z∈[-152,-42]mm · 7 of 30 slices shown, 9 images]
[im 4/30  brain]
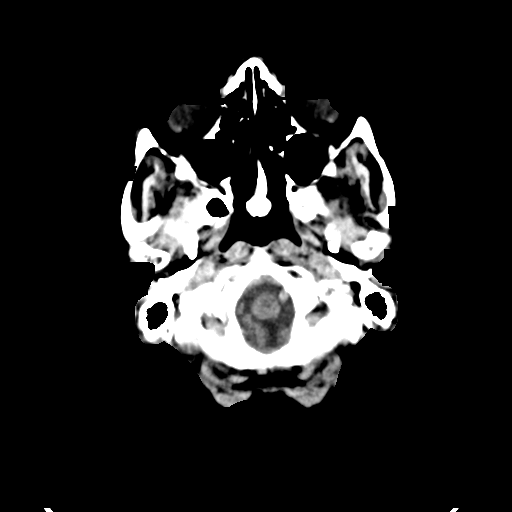
[im 4/30  bone]
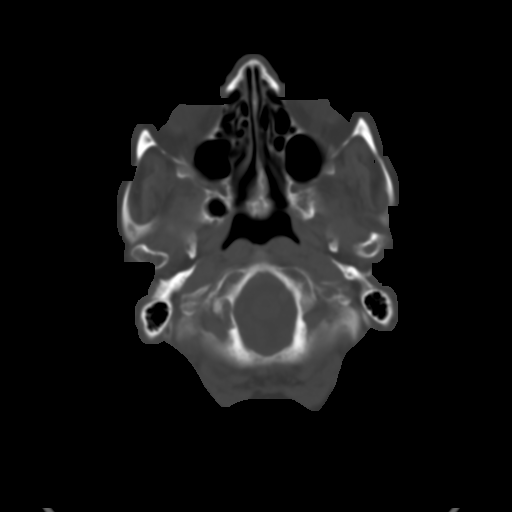
[im 8/30  brain]
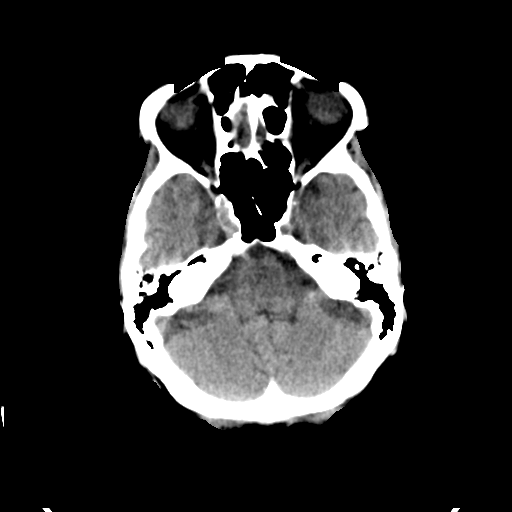
[im 11/30  brain]
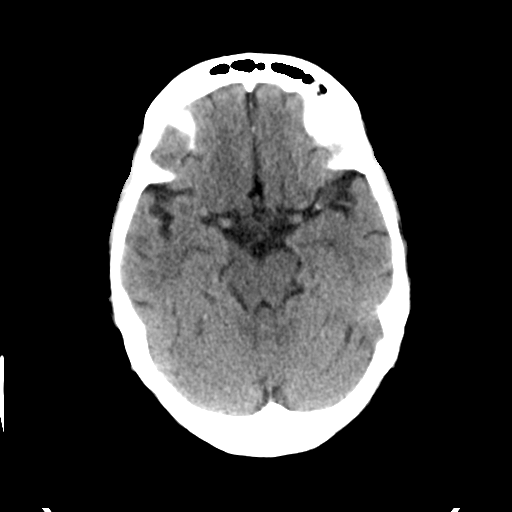
[im 15/30  brain]
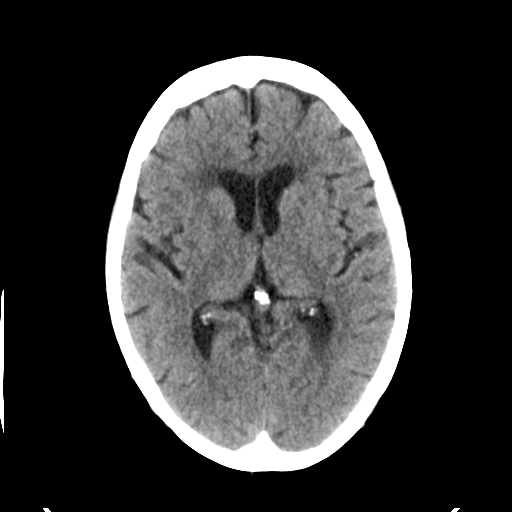
[im 19/30  brain]
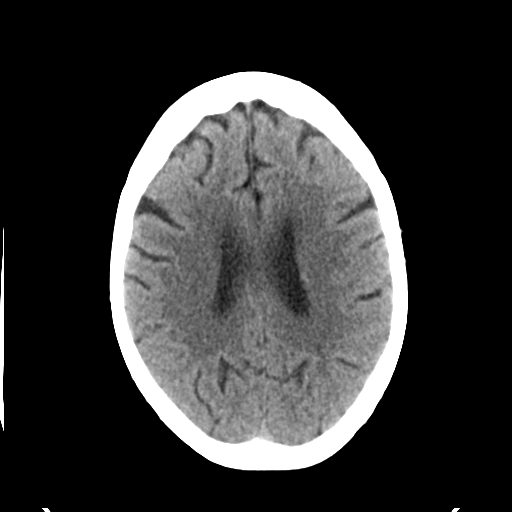
[im 19/30  bone]
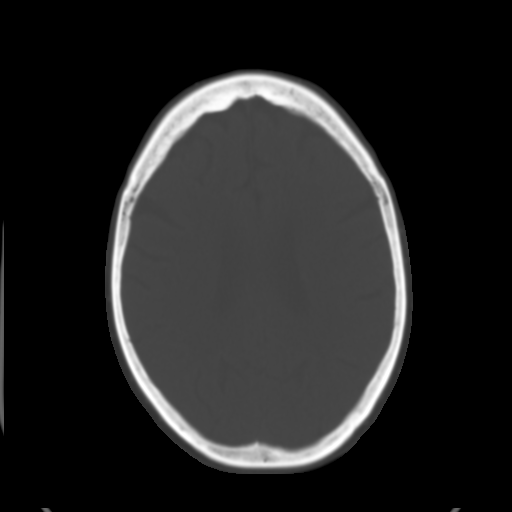
[im 22/30  brain]
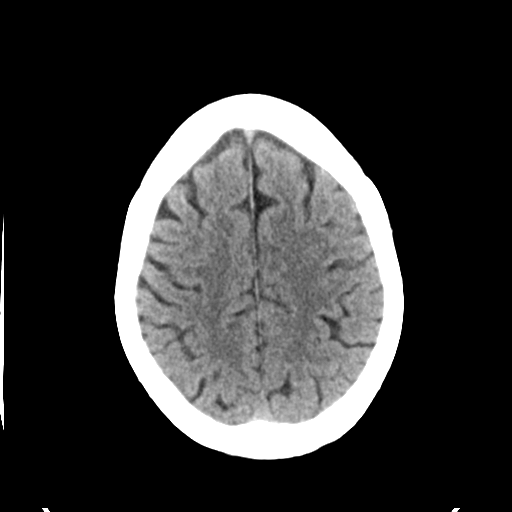
[im 26/30  brain]
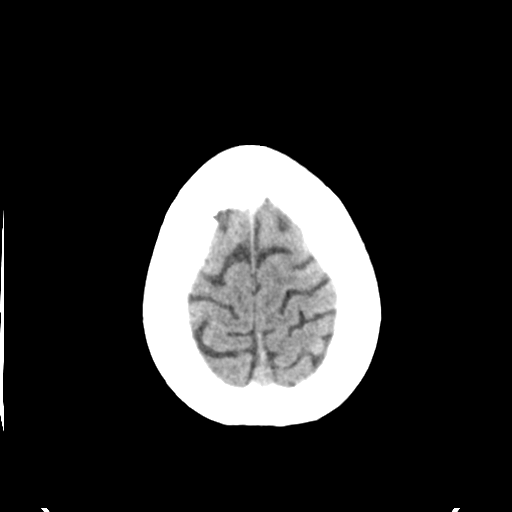

[Series 4: head bone · axial · 0.45mm/px · z∈[-153,-125]mm · 3 of 73 slices shown]
[im 8/73  bone]
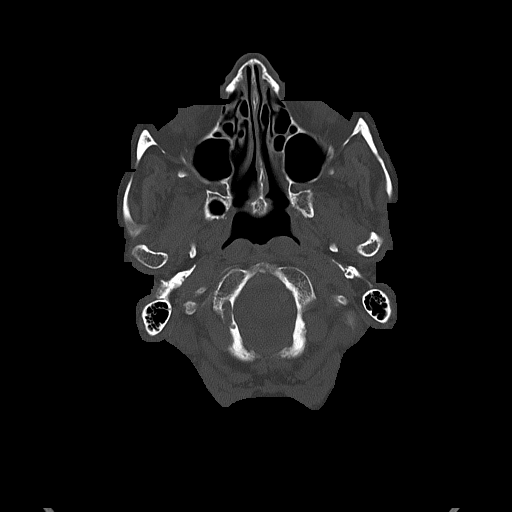
[im 15/73  bone]
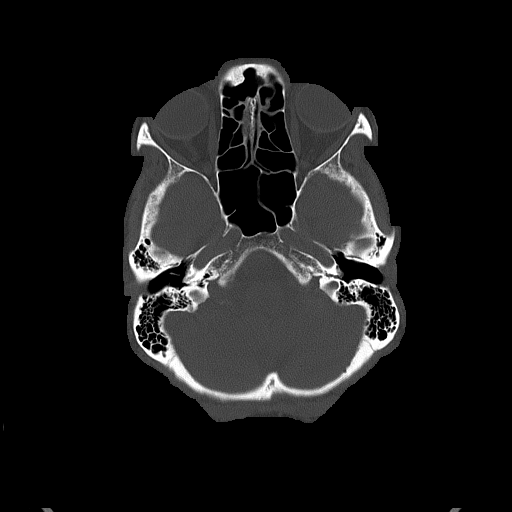
[im 22/73  bone]
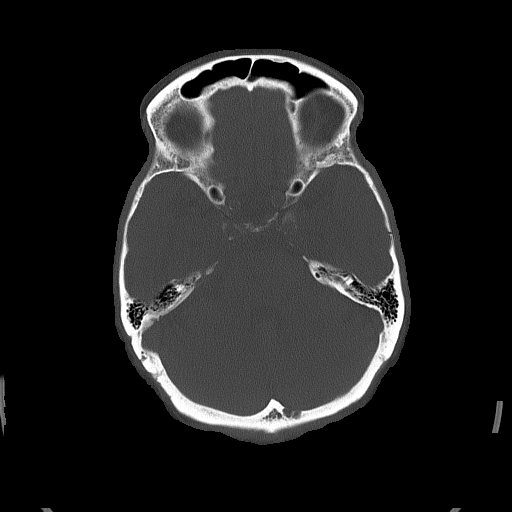

[Series 5: head without cor · coronal · non-contrast · 0.29mm/px · 3 of 67 slices shown]
[im 23/67  brain]
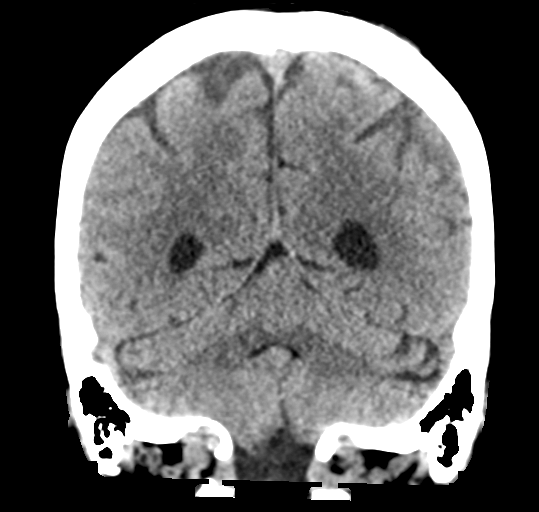
[im 30/67  brain]
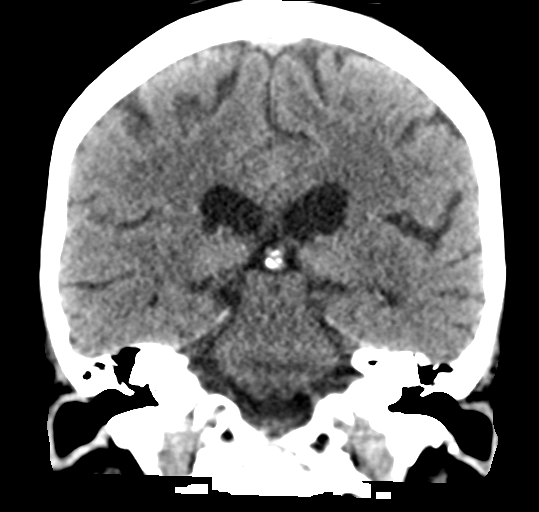
[im 37/67  brain]
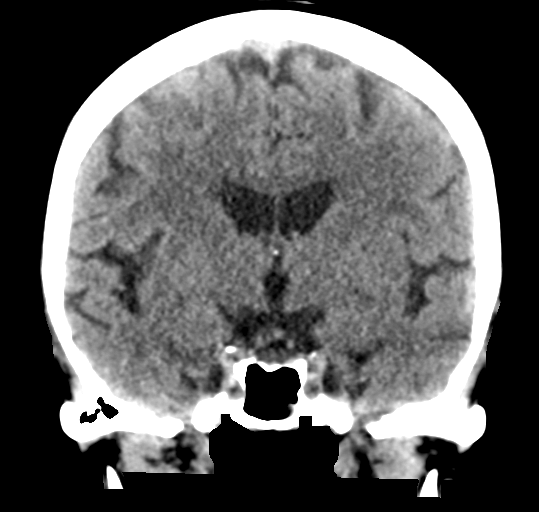

[Series 6: head without sag · sagittal · non-contrast · 0.30mm/px · 3 of 55 slices shown]
[im 19/55  brain]
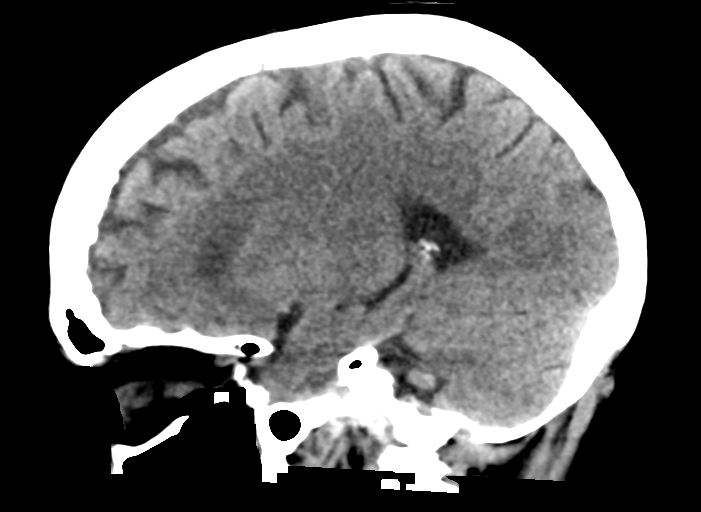
[im 28/55  brain]
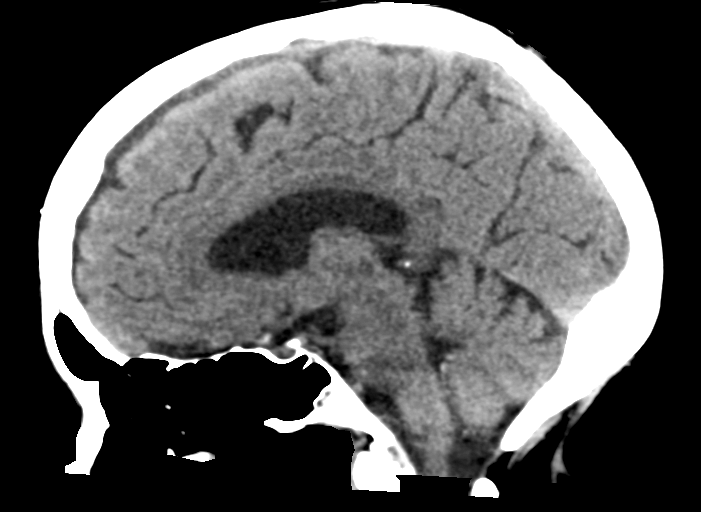
[im 37/55  brain]
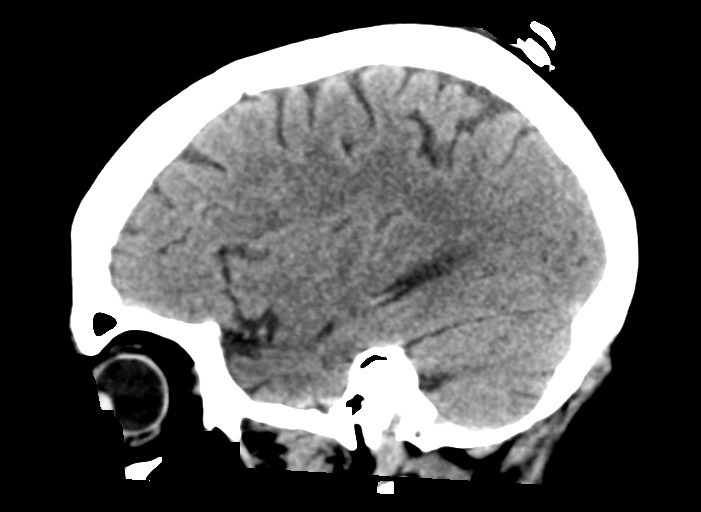

[16 of 47 positions shown; findings below may reference images not displayed]

FINDINGS: Brain: No evidence of acute infarction, hemorrhage, hydrocephalus,
extra-axial collection or mass lesion/mass effect. Nonspecific white
matter hypodensities are compatible with mild chronic microvascular
ischemic changes and there is mild volume loss of the brain for age.

Vascular: Calcific atherosclerosis of the carotid siphons. No
hyperdense vessel identified.

Skull: Normal. Negative for fracture or focal lesion.

Sinuses/Orbits: Small left maxillary and left posterior ethmoid air
cell mucous retention cyst. Additional visible paranasal sinuses and
the mastoid air cells are normally aerated.

Other: None.
IMPRESSION: 1. No acute intracranial abnormality identified.
2. Mild chronic microvascular ischemic changes and mild volume loss
of the brain for age.

## 2021-12-31 NOTE — Progress Notes (Signed)
Cardiology Office Note:    Date:  01/01/2022   ID:  Lydia Klein, DOB [birth year], MRN [id number]  PCP:  Timoteo Gaul, FNP  Cardiologist:  Shirlee More, MD   Referring MD: Ricard Dillon, NP  ASSESSMENT:    1. Heart murmur   2. Palpable abdominal aorta   3. Nonspecific abnormal electrocardiogram (ECG) (EKG)    PLAN:    In order of problems listed above:  Multiple abnormalities murmur on exam abnormal EKG cardiac echo is indicated does not appear to have severe valvular heart disease The physical exam strongly suggest aortic aneurysm duplex ordered I will plan to bring her back to the office if significant abnormality is seen, sometimes and very thin people the aorta appears to be enlarged and easily palpable  Next appointment.  As needed   Medication Adjustments/Labs and Tests Ordered: Current medicines are reviewed at length with the patient today.  Concerns regarding medicines are outlined above.  Orders Placed This Encounter  Procedures   EKG 12-Lead   No orders of the defined types were placed in this encounter.   Heart murmur on physical exam  History of Present Illness:    Lydia Klein is a 88 y.o. female with a history of CKD who is being seen today for the evaluation of heart murmur on physical examination suggesting aortic stenosis at the request of Klein, Lydia B, NP. When seen March 2020 there was no murmur noted on physical exam. Discharge diagnoses included hip fracture from fall right hip hypertension hyponatremia normocytic anemia small bowel obstruction.  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  She continues to live independently and has no known history of heart disease congenital rheumatic or atrial fibrillation Despite the documentation she tells me she has never had hypertension She remains active and has no exercise intolerance edema shortness of breath chest pain  palpitation or syncope   Past Medical History:  Diagnosis Date   Anxiety 05/27/2018   Body mass index (BMI) 19.9 or less, adult    Cardiac murmur, unspecified    Chronic kidney disease, stage 2 (mild)    Closed right hip fracture, initial encounter ([place]) 05/26/2018   Fall    Hypertension    Hyponatremia 05/26/2018   Normocytic anemia 05/26/2018   Personal history of (healed) osteoporosis fracture    Postoperative anemia due to acute blood loss    SBO (small bowel obstruction) ([place]) 05/29/2018   Underweight     Past Surgical History:  Procedure Laterality Date   ABDOMINAL HYSTERECTOMY     HIP ARTHROPLASTY Right 05/27/2018   Procedure: ARTHROPLASTY BIPOLAR HIP (HEMIARTHROPLASTY);  Surgeon: Altamese Love, MD;  Location: [facility];  Service: Orthopedics;  Laterality: Right;    Current Medications: Current Meds  Medication Sig   acetaminophen (TYLENOL) 500 MG tablet Take 1 tablet (500 mg total) by mouth every 8 (eight) hours as needed for mild pain or moderate pain.   alendronate (FOSAMAX) 70 MG tablet Take 70 mg by mouth once a week.   Cholecalciferol 125 MCG (5000 UT) TABS Take 1 tablet (5,000 Units total) by mouth daily.   cyanocobalamin (VITAMIN B12) 500 MCG tablet Take 500 mcg by mouth daily.   docusate sodium (COLACE) 100 MG capsule Take 1 capsule (100 mg total) by mouth 2 (two) times daily.   FARXIGA 5 MG TABS tablet Take 5 mg by mouth daily.   feeding supplement, ENSURE ENLIVE, (ENSURE ENLIVE) LIQD  Take 237 mLs by mouth 2 (two) times daily between meals.   HYDROcodone-acetaminophen (NORCO/VICODIN) 5-325 MG tablet Take 1-2 tablets by mouth every 6 (six) hours as needed for severe pain.   mirtazapine (REMERON) 15 MG tablet Take 7.5-15 mg by mouth at bedtime.   Multiple Vitamins-Minerals (COMPLETE MULTIVITAMIN/MINERAL PO) Take 1 tablet by mouth daily.   polyethylene glycol (MIRALAX / GLYCOLAX) packet Take 17 g by mouth daily as needed for mild constipation or moderate constipation.    senna (SENOKOT) 8.6 MG TABS tablet Take 2 tablets (17.2 mg total) by mouth at bedtime.   vitamin C (ASCORBIC ACID) 500 MG tablet Take 1 tablet (500 mg total) by mouth daily.     Allergies:   Codeine and Ibuprofen   Social History   Socioeconomic History   Marital status: Divorced    Spouse name: Not on file   Number of children: Not on file   Years of education: Not on file   Highest education level: Not on file  Occupational History   Not on file  Tobacco Use   Smoking status: Former   Smokeless tobacco: Never  Substance and Sexual Activity   Alcohol use: Not Currently   Drug use: Never   Sexual activity: Not on file  Other Topics Concern   Not on file  Social History Narrative   Not on file   Social Determinants of Health   Financial Resource Strain: Not on file  Food Insecurity: Not on file  Transportation Needs: Not on file  Physical Activity: Not on file  Stress: Not on file  Social Connections: Not on file     Family History: The patient's family history includes Heart disease in her father; Hypertension in her father.  ROS:   ROS Please see the history of present illness.     All other systems reviewed and are negative.  EKGs/Labs/Other Studies Reviewed:    The following studies were reviewed today:   EKG:  EKG is ordered today.  The ekg ordered today is personally reviewed and demonstrates sinus rhythm unusual QRS axis low voltage diffusely pattern suggesting previous anteroseptal MI   Physical Exam:    VS:  BP 98/60   Pulse 99   Ht 5\' 2"  (1.575 m)   Wt 91 lb 6.4 oz (41.5 kg)   SpO2 97%   BMI 16.72 kg/m     Wt Readings from Last 3 Encounters:  01/01/22 91 lb 6.4 oz (41.5 kg)  05/27/18 120 lb (54.4 kg)     GEN: She appears quite frail small muscle mass very alert cognitively intact  in no acute distress HEENT: Normal NECK: No JVD; No carotid bruits LYMPHATICS: No lymphadenopathy CARDIAC: She has a faint grade 1/6 murmur systolic ejection  in the aortic area I cannot appreciate any radiation to the carotids on my exam full upstrokes bilaterally examined in the setting in supine posture RRR, noubs, gallops RESPIRATORY:  Clear to auscultation without rales, wheezing or rhonchi  ABDOMEN: Soft, non-tender, non-distended abdominal aorta is easily palpated appears pulsatile and enlarged MUSCULOSKELETAL:  No edema; No deformity  SKIN: Warm and dry NEUROLOGIC:  Alert and oriented x 3 PSYCHIATRIC:  Normal affect     Signed, Shirlee More, MD  01/01/2022 2:18 PM    [facility]

## 2022-01-01 ENCOUNTER — Other Ambulatory Visit: Payer: Self-pay

## 2022-01-01 ENCOUNTER — Ambulatory Visit: Payer: PRIVATE HEALTH INSURANCE | Attending: Cardiology | Admitting: Cardiology

## 2022-01-01 VITALS — BP 98/60 | HR 99 | Ht 62.0 in | Wt 91.4 lb

## 2022-01-01 DIAGNOSIS — R011 Cardiac murmur, unspecified: Secondary | ICD-10-CM | POA: Insufficient documentation

## 2022-01-01 DIAGNOSIS — R0989 Other specified symptoms and signs involving the circulatory and respiratory systems: Secondary | ICD-10-CM

## 2022-01-01 DIAGNOSIS — R9431 Abnormal electrocardiogram [ECG] [EKG]: Secondary | ICD-10-CM

## 2022-01-01 DIAGNOSIS — Z681 Body mass index (BMI) 19 or less, adult: Secondary | ICD-10-CM | POA: Insufficient documentation

## 2022-01-01 DIAGNOSIS — R636 Underweight: Secondary | ICD-10-CM | POA: Insufficient documentation

## 2022-01-01 DIAGNOSIS — Z87310 Personal history of (healed) osteoporosis fracture: Secondary | ICD-10-CM | POA: Insufficient documentation

## 2022-01-01 DIAGNOSIS — N182 Chronic kidney disease, stage 2 (mild): Secondary | ICD-10-CM | POA: Insufficient documentation

## 2022-01-01 NOTE — Patient Instructions (Signed)
Medication Instructions:  Your physician recommends that you continue on your current medications as directed. Please refer to the Current Medication list given to you today.  *If you need a refill on your cardiac medications before your next appointment, please call your pharmacy*   Lab Work: NONE If you have labs (blood work) drawn today and your tests are completely normal, you will receive your results only by: [place] (if you have MyChart) OR A paper copy in the mail If you have any lab test that is abnormal or we need to change your treatment, we will call you to review the results.   Testing/Procedures: Your physician has requested that you have an echocardiogram. Echocardiography is a painless test that uses sound waves to create images of your heart. It provides your doctor with information about the size and shape of your heart and how well your heart's chambers and valves are working. This procedure takes approximately one hour. There are no restrictions for this procedure.   Your physician has requested that you have an abdominal aorta duplex. During this test, an ultrasound is used to evaluate the aorta. Allow 30 minutes for this exam. Do not eat after midnight the day before and avoid carbonated beverages    Follow-Up: At [facility], you and your health needs are our priority.  As part of our continuing mission to provide you with exceptional heart care, we have created designated Provider Care Teams.  These Care Teams include your primary Cardiologist (physician) and Advanced Practice Providers (APPs -  Physician Assistants and Nurse Practitioners) who all work together to provide you with the care you need, when you need it.  We recommend signing up for the patient portal called "MyChart".  Sign up information is provided on this After Visit Summary.  MyChart is used to connect with patients for Virtual Visits (Telemedicine).  Patients are able to view  lab/test results, encounter notes, upcoming appointments, etc.  Non-urgent messages can be sent to your provider as well.   To learn more about what you can do with MyChart, go to [email or web address].    Your next appointment:   As Needed   The format for your next appointment:   In Person  Provider:   Shirlee More, MD    Other Instructions   Important Information About Sugar

## 2022-01-13 ENCOUNTER — Telehealth: Payer: Self-pay

## 2022-01-13 ENCOUNTER — Ambulatory Visit: Payer: Medicare Other | Attending: Cardiology

## 2022-01-13 ENCOUNTER — Ambulatory Visit (INDEPENDENT_AMBULATORY_CARE_PROVIDER_SITE_OTHER): Payer: Medicare Other

## 2022-01-13 DIAGNOSIS — R9431 Abnormal electrocardiogram [ECG] [EKG]: Secondary | ICD-10-CM

## 2022-01-13 DIAGNOSIS — R011 Cardiac murmur, unspecified: Secondary | ICD-10-CM

## 2022-01-13 DIAGNOSIS — R0989 Other specified symptoms and signs involving the circulatory and respiratory systems: Secondary | ICD-10-CM

## 2022-01-13 LAB — ECHOCARDIOGRAM COMPLETE
Area-P 1/2: 3 cm2
S' Lateral: 2 cm

## 2022-01-13 NOTE — Telephone Encounter (Signed)
-----   Message from Richardo Priest, MD sent at 01/13/2022  1:02 PM EDT ----- This was done with concern of severe valvular heart disease  There is some mitral valve regurgitation or leakage not severe the remainder of her echocardiogram is good and there is no aneurysm in her abdomen  Overall these are good results  I suspect the murmur is much more prominent because of her very small body size.

## 2022-01-13 NOTE — Telephone Encounter (Signed)
Patient notified of results.

## 2024-04-20 ENCOUNTER — Encounter (HOSPITAL_COMMUNITY): Payer: Self-pay

## 2024-04-20 ENCOUNTER — Emergency Department (HOSPITAL_COMMUNITY)
Admission: EM | Admit: 2024-04-20 | Discharge: 2024-04-20 | Disposition: A | Discharge status: Home / Self Care | Attending: Emergency Medicine | Admitting: Emergency Medicine

## 2024-04-20 ENCOUNTER — Emergency Department (HOSPITAL_COMMUNITY)

## 2024-04-20 DIAGNOSIS — N189 Chronic kidney disease, unspecified: Secondary | ICD-10-CM | POA: Diagnosis not present

## 2024-04-20 DIAGNOSIS — I129 Hypertensive chronic kidney disease with stage 1 through stage 4 chronic kidney disease, or unspecified chronic kidney disease: Secondary | ICD-10-CM | POA: Diagnosis not present

## 2024-04-20 DIAGNOSIS — S42201A Unspecified fracture of upper end of right humerus, initial encounter for closed fracture: Secondary | ICD-10-CM | POA: Insufficient documentation

## 2024-04-20 DIAGNOSIS — W009XXA Unspecified fall due to ice and snow, initial encounter: Secondary | ICD-10-CM | POA: Diagnosis not present

## 2024-04-20 DIAGNOSIS — M25511 Pain in right shoulder: Secondary | ICD-10-CM | POA: Diagnosis present

## 2024-04-20 MED ORDER — HYDROCODONE-ACETAMINOPHEN 5-325 MG PO TABS
1.0000 | ORAL_TABLET | Freq: Once | ORAL | Status: AC
  Administered 2024-04-20: 1 via ORAL
  Filled 2024-04-20: qty 1

## 2024-04-20 MED ORDER — HYDROCODONE-ACETAMINOPHEN 5-325 MG PO TABS: 1.0000 | ORAL_TABLET | ORAL | 0 refills | Status: AC | PRN

## 2024-04-20 NOTE — ED Provider Notes (Signed)
 " [facility] Provider Note   CSN: [id number] Arrival date & time: 04/20/24  9086     Patient presents with: Fall and Shoulder Pain   Lydia Klein is a 89 y.o. female.   Pt is a 89 yo female with pmhx significant for HTN, Anxiety, CKD and osteoporosis.  Pt said she slipped on the ice yesterday and hurt her right arm.  She landed on her bottom, but that does not hurt now.  Her granddaughter lives with her and helps her.        Prior to Admission medications  Medication Sig Start Date End Date Taking? Authorizing Provider  HYDROcodone -acetaminophen  (NORCO/VICODIN) 5-325 MG tablet Take 1 tablet by mouth every 4 (four) hours as needed. 04/20/24  Yes Dean Clarity, MD  acetaminophen  (TYLENOL ) 500 MG tablet Take 1 tablet (500 mg total) by mouth every 8 (eight) hours as needed for mild pain or moderate pain. 05/31/18   Geralynn Charleston, MD  alendronate (FOSAMAX) 70 MG tablet Take 70 mg by mouth once a week. 12/17/21   [provider]  Cholecalciferol  125 MCG (5000 UT) TABS Take 1 tablet (5,000 Units total) by mouth daily. 05/31/18   Geralynn Charleston, MD  cyanocobalamin (VITAMIN B12) 500 MCG tablet Take 500 mcg by mouth daily.    [provider]  docusate sodium  (COLACE) 100 MG capsule Take 1 capsule (100 mg total) by mouth 2 (two) times daily. 05/31/18   Schertz, Charleston, MD  FARXIGA 5 MG TABS tablet Take 5 mg by mouth daily. 12/18/21   [provider]  feeding supplement, ENSURE ENLIVE, (ENSURE ENLIVE) LIQD Take 237 mLs by mouth 2 (two) times daily between meals. 05/31/18   Geralynn Charleston, MD  HYDROcodone -acetaminophen  (NORCO/VICODIN) 5-325 MG tablet Take 1-2 tablets by mouth every 6 (six) hours as needed for severe pain. 05/31/18   Geralynn Charleston, MD  mirtazapine (REMERON) 15 MG tablet Take 7.5-15 mg by mouth at bedtime. 12/17/21   [provider]  Multiple Vitamins-Minerals (COMPLETE MULTIVITAMIN/MINERAL PO) Take 1  tablet by mouth daily.    [provider]  polyethylene glycol (MIRALAX  / GLYCOLAX ) packet Take 17 g by mouth daily as needed for mild constipation or moderate constipation. 05/31/18   Geralynn Charleston, MD  senna (SENOKOT) 8.6 MG TABS tablet Take 2 tablets (17.2 mg total) by mouth at bedtime. 05/31/18   Geralynn Charleston, MD  vitamin C  (ASCORBIC ACID) 500 MG tablet Take 1 tablet (500 mg total) by mouth daily. 05/31/18   Geralynn Charleston, MD    Allergies: Codeine and Ibuprofen    Review of Systems  Updated Vital Signs BP (!) 146/70 (BP Location: Left Arm)   Pulse (!) 103   Temp 97.7 F (36.5 C) (Oral)   Resp 16   Ht 5' 2 (1.575 m)   Wt 41.3 kg   SpO2 100%   BMI 16.64 kg/m   Physical Exam  (all labs ordered are listed, but only abnormal results are displayed) Labs Reviewed - No data to display  EKG: None  Radiology: DG Shoulder Right Result Date: 04/20/2024 CLINICAL DATA:  Fall on ice with right shoulder pain. EXAM: RIGHT SHOULDER - 2+ VIEW COMPARISON:  None Available. FINDINGS: Acute comminuted fracture of the proximal right humerus present through the surgical neck and base of the greater tuberosity laterally. There is some impaction medially and greatest degree of separation laterally approaches 1.7 cm. Associated inferior subluxation of the humeral head with increased acromiohumeral distance. Calcifications  superior to the humeral head are consistent with calcific bursitis and tendinopathy. Degenerative disease of the [facility] joint. No underlying discrete bony lesions identified. IMPRESSION: Acute comminuted fracture of the proximal right humerus through the surgical neck and base of the greater tuberosity laterally. Associated inferior subluxation of the humeral head. Electronically Signed   By: Marcey Moan M.D.   On: 04/20/2024 11:06     Procedures   Medications Ordered in the ED  HYDROcodone -acetaminophen  (NORCO/VICODIN) 5-325 MG per tablet 1 tablet (1 tablet Oral Given  04/20/24 1214)                                    Medical Decision Making Amount and/or Complexity of Data Reviewed Radiology: ordered.  Risk Prescription drug management.   This patient presents to the ED for concern of upper arm pain, this involves an extensive number of treatment options, and is a complaint that carries with it a high risk of complications and morbidity.  The differential diagnosis includes fx, sprain, infection   Co morbidities that complicate the patient evaluation  HTN, Anxiety, CKD and osteoporosis   Additional history obtained:  Additional history obtained from epic chart review External records from outside source obtained and reviewed including son  Imaging Studies ordered:  I ordered imaging studies including r shoulder  I independently visualized and interpreted imaging which showed  Acute comminuted fracture of the proximal right humerus through the  surgical neck and base of the greater tuberosity laterally.  Associated inferior subluxation of the humeral head.   I agree with the radiologist interpretation   Medicines ordered and prescription drug management:  I ordered medication including lortab  for sx  Reevaluation of the patient after these medicines showed that the patient improved I have reviewed the patients home medicines and have made adjustments as needed   Problem List / ED Course:  Right proximal humerus fx:  pt does have someone to help her at home.  She is placed in a sling and is to f/u with ortho.  Return if worse.     Reevaluation:  After the interventions noted above, I reevaluated the patient and found that they have :improved   Social Determinants of Health:  Lives at home   Dispostion:  After consideration of the diagnostic results and the patients response to treatment, I feel that the patent would benefit from discharge with outpatient f/u.       Final diagnoses:  Closed fracture of proximal end  of right humerus, unspecified fracture morphology, initial encounter    ED Discharge Orders          Ordered    HYDROcodone -acetaminophen  (NORCO/VICODIN) 5-325 MG tablet  Every 4 hours PRN        04/20/24 1222               Dean Clarity, MD 04/20/24 1227  "

## 2024-04-20 NOTE — ED Triage Notes (Signed)
 Pt c/o R shoulder pain following a slip and fall on ice yesterday.  Pain score 5/10 w/ movement.  Limited movement noted.    Pt reports she landed on her bottom and palms of hands.
